# Patient Record
Sex: Female | Born: 1970 | Race: White | Hispanic: No | Smoking: Former smoker
Health system: Southern US, Community
[De-identification: ages and names within clinical notes are randomized; demographics above are authoritative.]

## PROBLEM LIST (undated history)

## (undated) DIAGNOSIS — F32A Depression, unspecified: Secondary | ICD-10-CM

## (undated) DIAGNOSIS — F419 Anxiety disorder, unspecified: Secondary | ICD-10-CM

## (undated) DIAGNOSIS — F329 Major depressive disorder, single episode, unspecified: Secondary | ICD-10-CM

## (undated) DIAGNOSIS — E162 Hypoglycemia, unspecified: Secondary | ICD-10-CM

## (undated) HISTORY — PX: TONSILLECTOMY: SUR1361

## (undated) HISTORY — PX: ANTERIOR CRUCIATE LIGAMENT REPAIR: SHX115

---

## 2010-03-31 ENCOUNTER — Emergency Department: Payer: Self-pay | Admitting: Emergency Medicine

## 2015-01-09 DIAGNOSIS — Z8781 Personal history of (healed) traumatic fracture: Secondary | ICD-10-CM | POA: Insufficient documentation

## 2015-10-07 ENCOUNTER — Encounter: Payer: Self-pay | Admitting: Emergency Medicine

## 2015-10-07 ENCOUNTER — Emergency Department
Admission: EM | Admit: 2015-10-07 | Discharge: 2015-10-07 | Disposition: A | Discharge status: Home / Self Care | Payer: Medicaid Other | Attending: Emergency Medicine | Admitting: Emergency Medicine

## 2015-10-07 DIAGNOSIS — F17210 Nicotine dependence, cigarettes, uncomplicated: Secondary | ICD-10-CM | POA: Insufficient documentation

## 2015-10-07 DIAGNOSIS — R42 Dizziness and giddiness: Secondary | ICD-10-CM | POA: Diagnosis present

## 2015-10-07 DIAGNOSIS — E860 Dehydration: Secondary | ICD-10-CM | POA: Diagnosis not present

## 2015-10-07 HISTORY — DX: Hypoglycemia, unspecified: E16.2

## 2015-10-07 LAB — URINALYSIS COMPLETE WITH MICROSCOPIC (ARMC ONLY)
BACTERIA UA: NONE SEEN
Bilirubin Urine: NEGATIVE
GLUCOSE, UA: NEGATIVE mg/dL
HGB URINE DIPSTICK: NEGATIVE
Leukocytes, UA: NEGATIVE
NITRITE: NEGATIVE
PROTEIN: NEGATIVE mg/dL
Specific Gravity, Urine: 1.001 — ABNORMAL LOW (ref 1.005–1.030)
pH: 6 (ref 5.0–8.0)

## 2015-10-07 LAB — CBC
HEMATOCRIT: 41.7 % (ref 35.0–47.0)
HEMOGLOBIN: 13.9 g/dL (ref 12.0–16.0)
MCH: 31.2 pg (ref 26.0–34.0)
MCHC: 33.3 g/dL (ref 32.0–36.0)
MCV: 93.8 fL (ref 80.0–100.0)
Platelets: 228 10*3/uL (ref 150–440)
RBC: 4.45 MIL/uL (ref 3.80–5.20)
RDW: 13.3 % (ref 11.5–14.5)
WBC: 8.5 10*3/uL (ref 3.6–11.0)

## 2015-10-07 LAB — BASIC METABOLIC PANEL
ANION GAP: 7 (ref 5–15)
BUN: 9 mg/dL (ref 6–20)
CO2: 25 mmol/L (ref 22–32)
Calcium: 8.5 mg/dL — ABNORMAL LOW (ref 8.9–10.3)
Chloride: 101 mmol/L (ref 101–111)
Creatinine, Ser: 0.67 mg/dL (ref 0.44–1.00)
GFR calc Af Amer: >60 mL/min (ref >60)
GFR calc non Af Amer: >60 mL/min (ref >60)
GLUCOSE: 90 mg/dL (ref 65–99)
POTASSIUM: 3.8 mmol/L (ref 3.5–5.1)
Sodium: 133 mmol/L — ABNORMAL LOW (ref 135–145)

## 2015-10-07 LAB — GLUCOSE, CAPILLARY: Glucose-Capillary: 76 mg/dL (ref 65–99)

## 2015-10-07 MED ORDER — SODIUM CHLORIDE 0.9 % IV BOLUS (SEPSIS)
1000.0000 mL | Freq: Once | INTRAVENOUS | Status: AC
  Administered 2015-10-07: 1000 mL via INTRAVENOUS

## 2015-10-07 NOTE — ED Provider Notes (Addendum)
[facility] Emergency Department Provider Note  ____________________________________________   I have reviewed the triage vital signs and the nursing notes.   HISTORY  Chief Complaint Dizziness    HPI Susan Schaefer is a 45 y.o. female who is healthy aside from "hypoglycemia" every once well when she does not eat which causes her to get lightheaded and tobacco abuse. Not a drinker. States that yesterday she had no food all day long essentially since breakfast to the end of the day and she felt very lightheaded and had to sit down on a box. She states she came very close to passing out. She denies falling or hitting her head. She had no seizure activity. This is not unusual when she does not eat. Today she had a sandwich at lunch and she still feels lightheaded. She has had lightheadedness all day. She denies any true vertigo, she has no neurologic symptoms and her review of systems is otherwise completely normal. She denies any rash fever chills nausea vomiting diarrhea, melena, bright red blood per rectum, headache, stiff neck, palpitations, she denies early cardiac history in her family, she denies chest pain she denies shortness of breath she denies abdominal pain, she denies dysuria she denies urinary frequency she denies any focal numbness or weakness. Her only complaint is that she feels somewhat lightheaded. She thinks she is dehydrated. She has not had much to drink today.  Past Medical History  Diagnosis Date  . Hypoglycemia     There are no active problems to display for this patient.   Past Surgical History  Procedure Laterality Date  . Tonsillectomy      No current outpatient prescriptions on file.  Allergies Codeine; Hydrocodone; and Tramadol  No family history on file.  Social History Social History  Substance Use Topics  . Smoking status: Current Every Day Smoker -- 0.50 packs/day    Types: Cigarettes  . Smokeless tobacco: None  .  Alcohol Use: No    Review of Systems Constitutional: No fever/chills Eyes: No visual changes. ENT: No sore throat. No stiff neck no neck pain Cardiovascular: Denies chest pain. Respiratory: Denies shortness of breath. Gastrointestinal:   no vomiting.  No diarrhea.  No constipation. Genitourinary: Negative for dysuria. Musculoskeletal: Negative lower extremity swelling Skin: Negative for rash. Neurological: Negative for headaches, focal weakness or numbness. 10-point ROS otherwise negative.  ____________________________________________   PHYSICAL EXAM:  VITAL SIGNS: ED Triage Vitals  Enc Vitals Group     BP 10/07/15 1605 120/80 mmHg     Pulse Rate 10/07/15 1605 85     Resp 10/07/15 1605 20     Temp 10/07/15 1605 97.8 F (36.6 C)     Temp Source 10/07/15 1605 Oral     SpO2 10/07/15 1605 100 %     Weight 10/07/15 1605 155 lb (70.308 kg)     Height 10/07/15 1605  (1.6 m)     Head Cir --      Peak Flow --      Pain Score --      Pain Loc --      Pain Edu? --      Excl. in GC? --     Constitutional: Alert and oriented. Well appearing and in no acute distress. Eyes: Conjunctivae are normal. PERRL. EOMI. Head: Atraumatic. Nose: No congestion/rhinnorhea., TMs and the left shows some degree of scar from old otitis media but no active effusion or erythema, right TM is normal. Mouth/Throat: Mucous membranes are  moist.  Oropharynx non-erythematous. Neck: No stridor.   Nontender with no meningismus Cardiovascular: Normal rate, regular rhythm. Grossly normal heart sounds.  Good peripheral circulation. Respiratory: Normal respiratory effort.  No retractions. Lungs CTAB. Abdominal: Soft and nontender. No distention. No guarding no rebound Back:  There is no focal tenderness or step off there is no midline tenderness there are no lesions noted. there is no CVA tenderness Musculoskeletal: No lower extremity tenderness. No joint effusions, no DVT signs strong distal pulses no  edema Neurologic:  Cranial nerves II through XII are grossly intact 5 out of 5 strength bilateral upper and lower extremity. Finger to nose within normal limits heel to shin within normal limits, speech is normal with no word finding difficulty or dysarthria, reflexes symmetric, pupils are equally round and reactive to light, there is no pronator drift, sensation is normal, vision is intact to confrontation, gait is deferred, there is no nystagmus, normal neurologic exam Skin:  Skin is warm, dry and intact. No rash noted. Psychiatric: Mood and affect are normal. Speech and behavior are normal.  ____________________________________________   LABS (all labs ordered are listed, but only abnormal results are displayed)  Labs Reviewed  BASIC METABOLIC PANEL - Abnormal; Notable for the following:    Sodium 133 (*)    Calcium 8.5 (*)    All other components within normal limits  GLUCOSE, CAPILLARY  CBC  URINALYSIS COMPLETEWITH MICROSCOPIC ([facility] ONLY)  CBG MONITORING, ED   ____________________________________________  EKG  I personally interpreted any EKGs ordered by me or triage Normal sinus rhythm at 77 bpm no acute ST elevation or acute ST depression normal axis unremarkable EKG ____________________________________________  RADIOLOGY  I reviewed any imaging ordered by me or triage that were performed during my shift ____________________________________________   PROCEDURES  Procedure(s) performed: None  Critical Care performed: None  ____________________________________________   INITIAL IMPRESSION / ASSESSMENT AND PLAN / ED COURSE  Pertinent labs & imaging results that were available during my care of the patient were reviewed by me and considered in my medical decision making (see chart for details).  Patient presents today complaining of feeling lightheaded. Vital signs are reassuring blood work is reassuring thus far exam is normal no evidence of CVA NIH stroke scale 0  no evidence of CAD no evidence of PE no evidence of anemia. The number of things that can cause someone to feel lightheaded R of course or the neck and in enumerate here but her exam is normal her vital signs are normal and her basic blood work is normal and her EKG are normal. I feel that patient would benefit from IV fluid as she feels dehydrated we will give her fluids and reassess  ----------------------------------------- 5:49 PM on 10/07/2015 -----------------------------------------  After IV fluid patient's states she feels 100% better. She is ambulating in the department with no difficulty. We'll see if she can tolerate by mouth she does not feel lightheaded. She has trace ketones in her urine and states that she has not been drinking very much fluid of the last few days because she has been so busy at work. No evidence of cardiogenic neurogenic, coagulopathic, intra-abdominal, or other acute pathology suggestive of the need for admission or further workup today. ____________________________________________   FINAL CLINICAL IMPRESSION(S) / ED DIAGNOSES  Final diagnoses:  None      This chart was dictated using voice recognition software.  Despite best efforts to proofread,  errors can occur which can change meaning.     Fayrene Fearing  Lynnea Maizes, MD 10/07/15 1711  Jeanmarie Plant, MD 10/07/15 [phone]

## 2015-10-07 NOTE — ED Notes (Signed)
Pt provided with orange juice.

## 2015-10-07 NOTE — Discharge Instructions (Signed)

## 2015-10-07 NOTE — ED Notes (Signed)
Pt presents to the ER from home with complaints of dizziness. Pt reports she was working yesterday and passed out, denies any injury, reports this morning when she woke up she felt dizzy. Pt reports she has a history of hypoglycemia, current CBG 76. Pt denies any pain, reports just dizziness, reports she has had lunch. Pt talks in complete sentences no respiratory distress noted.

## 2015-10-12 DIAGNOSIS — L732 Hidradenitis suppurativa: Secondary | ICD-10-CM | POA: Insufficient documentation

## 2015-10-12 DIAGNOSIS — N921 Excessive and frequent menstruation with irregular cycle: Secondary | ICD-10-CM | POA: Insufficient documentation

## 2015-11-26 ENCOUNTER — Encounter: Payer: Self-pay | Admitting: Emergency Medicine

## 2015-11-26 ENCOUNTER — Emergency Department
Admission: EM | Admit: 2015-11-26 | Discharge: 2015-11-26 | Disposition: A | Discharge status: Home / Self Care | Payer: Medicaid Other | Attending: Emergency Medicine | Admitting: Emergency Medicine

## 2015-11-26 DIAGNOSIS — E162 Hypoglycemia, unspecified: Secondary | ICD-10-CM | POA: Diagnosis not present

## 2015-11-26 DIAGNOSIS — N938 Other specified abnormal uterine and vaginal bleeding: Secondary | ICD-10-CM | POA: Diagnosis not present

## 2015-11-26 DIAGNOSIS — F17210 Nicotine dependence, cigarettes, uncomplicated: Secondary | ICD-10-CM | POA: Insufficient documentation

## 2015-11-26 LAB — CBC
HEMATOCRIT: 40.6 % (ref 35.0–47.0)
HEMOGLOBIN: 14 g/dL (ref 12.0–16.0)
MCH: 31.3 pg (ref 26.0–34.0)
MCHC: 34.4 g/dL (ref 32.0–36.0)
MCV: 91 fL (ref 80.0–100.0)
Platelets: 224 10*3/uL (ref 150–440)
RBC: 4.46 MIL/uL (ref 3.80–5.20)
RDW: 12.7 % (ref 11.5–14.5)
WBC: 9.6 10*3/uL (ref 3.6–11.0)

## 2015-11-26 LAB — COMPREHENSIVE METABOLIC PANEL
ALT: 11 U/L — ABNORMAL LOW (ref 14–54)
ANION GAP: 6 (ref 5–15)
AST: 10 U/L — ABNORMAL LOW (ref 15–41)
Albumin: 4.3 g/dL (ref 3.5–5.0)
Alkaline Phosphatase: 70 U/L (ref 38–126)
BILIRUBIN TOTAL: 0.6 mg/dL (ref 0.3–1.2)
BUN: 9 mg/dL (ref 6–20)
CO2: 22 mmol/L (ref 22–32)
Calcium: 8.7 mg/dL — ABNORMAL LOW (ref 8.9–10.3)
Chloride: 104 mmol/L (ref 101–111)
Creatinine, Ser: 0.78 mg/dL (ref 0.44–1.00)
GFR calc Af Amer: >60 mL/min (ref >60)
Glucose, Bld: 88 mg/dL (ref 65–99)
POTASSIUM: 4.1 mmol/L (ref 3.5–5.1)
Sodium: 132 mmol/L — ABNORMAL LOW (ref 135–145)
TOTAL PROTEIN: 7.3 g/dL (ref 6.5–8.1)

## 2015-11-26 LAB — HCG, QUANTITATIVE, PREGNANCY: hCG, Beta Chain, Quant, S: 1 m[IU]/mL (ref <5)

## 2015-11-26 NOTE — Discharge Instructions (Signed)

## 2015-11-26 NOTE — ED Notes (Signed)
Pt informed to return if any life threatening symptoms occur.  

## 2015-11-26 NOTE — ED Notes (Signed)
Pt to ed with c/o vaginal bleeding x 3 weeks.  Pt states she is going through 4 tampons in 1 hour today.  Pt reports she is on depo, called her dr and was told to come here.

## 2015-11-27 NOTE — ED Provider Notes (Signed)
[facility] Emergency Department Provider Note  ____________________________________________    I have reviewed the triage vital signs and the nursing notes.   HISTORY  Chief Complaint Vaginal Bleeding    HPI Susan Schaefer is a 45 y.o. female who presents with vaginal bleeding. Patient reports she has a long history of abnormal bleeding. She has been on Depo for several months but cannot remember when she received the shot. She reports she has been bleeding for approximately 3 weeks intermittently but today was worse than normal. She used tampons in one hour. She does report it seems to have improved since then. No abdominal pain or dizziness.     Past Medical History  Diagnosis Date  . Hypoglycemia     There are no active problems to display for this patient.   Past Surgical History  Procedure Laterality Date  . Tonsillectomy      No current outpatient prescriptions on file.  Allergies Codeine; Doxycycline; Hydrocodone; and Tramadol  History reviewed. No pertinent family history.  Social History Social History  Substance Use Topics  . Smoking status: Current Schaefer Day Smoker -- 0.50 packs/day    Types: Cigarettes  . Smokeless tobacco: None  . Alcohol Use: No    Review of Systems  Constitutional: Negative for dizziness Eyes: Negative for pallor ENT: Negative for sore throat Cardiovascular: Negative for chest pain Respiratory: Negative for shortness of breath. Gastrointestinal: Negative for abdominal pain Genitourinary: Negative for dysuria. As above Musculoskeletal: Negative for muscle pain Skin: Negative for pallor Neurological: Negative for focal weakness Psychiatric: no anxiety    ____________________________________________   PHYSICAL EXAM:  VITAL SIGNS: ED Triage Vitals  Enc Vitals Group     BP 11/26/15 1130 122/80 mmHg     Pulse Rate 11/26/15 1130 97     Resp 11/26/15 1130 18     Temp 11/26/15 1130 98.6 F  (37 C)     Temp Source 11/26/15 1130 Oral     SpO2 11/26/15 1130 95 %     Weight 11/26/15 1130 154 lb (69.854 kg)     Height 11/26/15 1130 5' 3.5" (1.613 m)     Head Cir --      Peak Flow --      Pain Score 11/26/15 1130 6     Pain Loc --      Pain Edu? --      Excl. in GC? --     Constitutional: Alert and oriented. Well appearing and in no distress. Pleasant and interactive Eyes: Conjunctivae are normal. No erythema or injection ENT   Head: Normocephalic and atraumatic.   Mouth/Throat: Mucous membranes are moist. Cardiovascular: Normal rate, regular rhythm. Normal and symmetric distal pulses are present in the upper extremities.  Respiratory: Normal respiratory effort without tachypnea nor retractions.  Gastrointestinal: Soft and non-tender in all quadrants. No distention. There is no CVA tenderness. Genitourinary: deferred  Neurologic:  Normal speech and language. No gross focal neurologic deficits are appreciated. Skin:  Skin is warm, dry and intact. No rash noted. Psychiatric: Mood and affect are normal. Patient exhibits appropriate insight and judgment.  ____________________________________________    LABS (pertinent positives/negatives)  Labs Reviewed  COMPREHENSIVE METABOLIC PANEL - Abnormal; Notable for the following:    Sodium 132 (*)    Calcium 8.7 (*)    AST 10 (*)    ALT 11 (*)    All other components within normal limits  HCG, QUANTITATIVE, PREGNANCY  CBC    ____________________________________________  EKG  None  ____________________________________________    RADIOLOGY  None  ____________________________________________   PROCEDURES  Procedure(s) performed: none  Critical Care performed: none  ____________________________________________   INITIAL IMPRESSION / ASSESSMENT AND PLAN / ED COURSE  Pertinent labs & imaging results that were available during my care of the patient were reviewed by me and considered in my medical  decision making (see chart for details).  Patient well-appearing and in no distress. Vital signs are normal. HCG is negative. Hemoglobin is normal. I discussed with her the need for GYN follow-up and she agrees. She would like to treat this more aggressively. I discussed with her GYN exam given ongoing bleeding and she would prefer not to do an exam at this time which I agree with. ____________________________________________   FINAL CLINICAL IMPRESSION(S) / ED DIAGNOSES  Final diagnoses:  DUB (dysfunctional uterine bleeding)          Susan Every, MD 11/27/15 [phone]

## 2016-01-08 NOTE — H&P (Signed)
Chief Complaint:    [name] is a 45 y.o. female [facility] for men[facility]agia  EMBX : negative  SIS : 2 small fibroids 21x22 mm + 22x22 mm No endometrial pathology seen    Past Medical History:  has no past medical history on file.  Past Surgical History:  has a past surgical history that includes knee surgery and Tonsillectomy. Family History: family history includes Depression in her brother and father. Social History:  reports that she has been smoking Cigarettes. She has a 42.00 pack-year smoking history. She has never used smokeless tobacco. She reports that she drinks about 1.2 oz of alcohol per week She reports that she does not use illicit drugs. OB/GYN History:  OB History    Gravida Para Term Preterm AB TAB SAB Ectopic Multiple Living   1 1 1       1       Allergies: is allergic to codeine; tramadol; and hydrocodone. Medications:  Current Outpatient Prescriptions:  . escitalopram oxalate (LEXAPRO) 10 MG tablet, Take 10 mg by mouth once daily., Disp: , Rfl:  . medroxyPROGESTERone (DEPO-PROVERA) 150 mg/mL injection, Inject 1 mL (150 mg total) into the muscle every 3 (three) months., Disp: 1 mL, Rfl: prn  Current Facility-Administered Medications:  . medroxyPROGESTERone (DEPO-PROVERA) injection 150 mg, 150 mg, Intramuscular, Q90 Days, Susan MouldElizabeth Burney White, NP, 150 mg at 10/12/15 1609  Review of Systems: General:   No fatigue or weight loss Eyes:   No vision changes Ears:   No hearing difficulty Respiratory:   No cough or shortness of breath Pulmonary:   No asthma or shortness of breath Cardiovascular:  No chest pain, palpitations, dyspnea on exertion Gastrointestinal:  No abdominal bloating, chronic diarrhea, constipations, masses, pain or hematochezia Genitourinary:  No hematuria, dysuria, abnormal vaginal discharge, pelvic pain, Menometrorrhagia Lymphatic:  No swollen lymph nodes Musculoskeletal: No muscle  weakness Neurologic:  No extremity weakness, syncope, seizure disorder Psychiatric:  No history of depression, delusions or suicidal/homicidal ideation   Exam:   There were no vitals filed for this visit.  There is no height or weight on file to calculate BMI.  WDWN Schaefer/  female in NAD  Lungs: CTA  CV : RRR without murmur  Breast: exam done in sitting and lying position : No dimpling or retraction, no dominant mass, no spontaneous discharge, no axillary adenopathy Neck: no thyromegaly Abdomen: soft , no mass, normal active bowel sounds, non-tender, no rebound tenderness Pelvic: tanner stage 5 ,  External genitalia: vulva /labia no lesions Urethra: no prolapse Vagina: normal physiologic d/c Cervix: no lesions, no cervical motion tenderness  Uterus: normal size shape and contour, non-tender Adnexa: no mass, non-tender  Rectovaginal: no mass heme negative  Impression:   The encounter diagnosis was Menometrorrhagia. No source of bleeding noted on SIS   Plan:   I have spoken with the patient regarding treatment options including expectant management, hormonal options, or surgical intervention. After a full discussion the pt [facility]s [facility]      Susan PraderHOMAS JANSE [name], MD       Electronically signed by Susan Praderhomas Janse [name], MD at 12/13/2015 12:01 PM

## 2016-01-09 ENCOUNTER — Encounter
Admission: RE | Admit: 2016-01-09 | Discharge: 2016-01-09 | Disposition: A | Discharge status: Home / Self Care | Payer: Medicaid Other | Source: Ambulatory Visit | Attending: Obstetrics and Gynecology | Admitting: Obstetrics and Gynecology

## 2016-01-09 DIAGNOSIS — Z01812 Encounter for preprocedural laboratory examination: Secondary | ICD-10-CM | POA: Insufficient documentation

## 2016-01-09 HISTORY — DX: Major depressive disorder, single episode, unspecified: F32.9

## 2016-01-09 HISTORY — DX: Anxiety disorder, unspecified: F41.9

## 2016-01-09 HISTORY — DX: Depression, unspecified: F32.A

## 2016-01-09 LAB — CBC
HCT: 43 % (ref 35.0–47.0)
Hemoglobin: 14.1 g/dL (ref 12.0–16.0)
MCH: 31.3 pg (ref 26.0–34.0)
MCHC: 32.9 g/dL (ref 32.0–36.0)
MCV: 95.1 fL (ref 80.0–100.0)
PLATELETS: 208 10*3/uL (ref 150–440)
RBC: 4.52 MIL/uL (ref 3.80–5.20)
RDW: 13.5 % (ref 11.5–14.5)
WBC: 6 10*3/uL (ref 3.6–11.0)

## 2016-01-09 LAB — BASIC METABOLIC PANEL
Anion gap: 5 (ref 5–15)
BUN: 13 mg/dL (ref 6–20)
CHLORIDE: 106 mmol/L (ref 101–111)
CO2: 27 mmol/L (ref 22–32)
CREATININE: 0.81 mg/dL (ref 0.44–1.00)
Calcium: 8.8 mg/dL — ABNORMAL LOW (ref 8.9–10.3)
GFR calc Af Amer: >60 mL/min (ref >60)
GFR calc non Af Amer: >60 mL/min (ref >60)
Glucose, Bld: 79 mg/dL (ref 65–99)
Potassium: 4.1 mmol/L (ref 3.5–5.1)
Sodium: 138 mmol/L (ref 135–145)

## 2016-01-09 LAB — TYPE AND SCREEN
ABO/RH(D): A POS
Antibody Screen: NEGATIVE

## 2016-01-09 LAB — ABO/RH: ABO/RH(D): A POS

## 2016-01-09 NOTE — Patient Instructions (Signed)
Your procedure is scheduled on: Monday 01/14/16 Report to Day Surgery. 2ND FLOOR MEDICAL MALL ENTRANCE To find out your arrival time please call [phone] between 1PM - 3PM on Friday 01/11/16.  Remember: Instructions that are not followed completely may result in serious medical risk, up to and including death, or upon the discretion of your surgeon and anesthesiologist your surgery may need to be rescheduled.    __X__ 1. Do not eat food or drink liquids after midnight. No gum chewing or hard candies.     __X__ 2. No Alcohol for 24 hours before or after surgery.   ____ 3. Bring all medications with you on the day of surgery if instructed.    __X__ 4. Notify your doctor if there is any change in your medical condition     (cold, fever, infections).     Do not wear jewelry, make-up, hairpins, clips or nail polish.  Do not wear lotions, powders, or perfumes.   Do not shave 48 hours prior to surgery. Men may shave face and neck.  Do not bring valuables to the hospital.    [facility] is not responsible for any belongings or valuables.               Contacts, dentures or bridgework may not be worn into surgery.  Leave your suitcase in the car. After surgery it may be brought to your room.  For patients admitted to the hospital, discharge time is determined by your                treatment team.   Patients discharged the day of surgery will not be allowed to drive home.   Please read over the following fact sheets that you were given:   Surgical Site Infection Prevention   ____ Take these medicines the morning of surgery with A SIP OF WATER:    1. NONE  2.   3.   4.  5.  6.  ____ Fleet Enema (as directed)   __X__ Use CHG Soap as directed  ____ Use inhalers on the day of surgery  ____ Stop metformin 2 days prior to surgery    ____ Take 1/2 of usual insulin dose the night before surgery and none on the morning of surgery.   ____ Stop Coumadin/Plavix/aspirin on   __X__  Stop Anti-inflammatories on TODAY (IBUPROFEN) MAY USE TYLENOL    ____ Stop supplements until after surgery.    ____ Bring C-Pap to the hospital.

## 2016-01-14 ENCOUNTER — Encounter
Admission: RE | Disposition: A | Discharge status: Home / Self Care | Payer: Self-pay | Source: Ambulatory Visit | Attending: Obstetrics and Gynecology

## 2016-01-14 ENCOUNTER — Ambulatory Visit: Payer: Medicaid Other | Admitting: Anesthesiology

## 2016-01-14 ENCOUNTER — Observation Stay
Admission: RE | Admit: 2016-01-14 | Discharge: 2016-01-15 | Disposition: A | Discharge status: Home / Self Care | Payer: Medicaid Other | Source: Ambulatory Visit | Attending: Obstetrics and Gynecology | Admitting: Obstetrics and Gynecology

## 2016-01-14 DIAGNOSIS — F419 Anxiety disorder, unspecified: Secondary | ICD-10-CM | POA: Diagnosis not present

## 2016-01-14 DIAGNOSIS — Z885 Allergy status to narcotic agent status: Secondary | ICD-10-CM | POA: Diagnosis not present

## 2016-01-14 DIAGNOSIS — N888 Other specified noninflammatory disorders of cervix uteri: Secondary | ICD-10-CM | POA: Diagnosis not present

## 2016-01-14 DIAGNOSIS — Z79899 Other long term (current) drug therapy: Secondary | ICD-10-CM | POA: Diagnosis not present

## 2016-01-14 DIAGNOSIS — Z818 Family history of other mental and behavioral disorders: Secondary | ICD-10-CM | POA: Insufficient documentation

## 2016-01-14 DIAGNOSIS — N920 Excessive and frequent menstruation with regular cycle: Secondary | ICD-10-CM | POA: Diagnosis present

## 2016-01-14 DIAGNOSIS — F329 Major depressive disorder, single episode, unspecified: Secondary | ICD-10-CM | POA: Diagnosis not present

## 2016-01-14 DIAGNOSIS — N800 Endometriosis of uterus: Secondary | ICD-10-CM | POA: Insufficient documentation

## 2016-01-14 DIAGNOSIS — N879 Dysplasia of cervix uteri, unspecified: Secondary | ICD-10-CM | POA: Insufficient documentation

## 2016-01-14 DIAGNOSIS — Z9889 Other specified postprocedural states: Secondary | ICD-10-CM | POA: Diagnosis not present

## 2016-01-14 DIAGNOSIS — F17210 Nicotine dependence, cigarettes, uncomplicated: Secondary | ICD-10-CM | POA: Insufficient documentation

## 2016-01-14 DIAGNOSIS — Z793 Long term (current) use of hormonal contraceptives: Secondary | ICD-10-CM | POA: Diagnosis not present

## 2016-01-14 HISTORY — PX: VAGINAL HYSTERECTOMY: SHX2639

## 2016-01-14 HISTORY — PX: BILATERAL SALPINGECTOMY: SHX5743

## 2016-01-14 LAB — POCT PREGNANCY, URINE: PREG TEST UR: NEGATIVE

## 2016-01-14 LAB — GLUCOSE, CAPILLARY: GLUCOSE-CAPILLARY: 99 mg/dL (ref 65–99)

## 2016-01-14 SURGERY — HYSTERECTOMY, VAGINAL
Anesthesia: General

## 2016-01-14 MED ORDER — FAMOTIDINE 20 MG PO TABS
ORAL_TABLET | ORAL | Status: AC
  Administered 2016-01-14: 20 mg via ORAL
  Filled 2016-01-14: qty 1

## 2016-01-14 MED ORDER — ONDANSETRON HCL 4 MG/2ML IJ SOLN
4.0000 mg | Freq: Once | INTRAMUSCULAR | Status: DC | PRN
Start: 2016-01-14 — End: 2016-01-14

## 2016-01-14 MED ORDER — LACTATED RINGERS IV SOLN
INTRAVENOUS | Status: DC
  Administered 2016-01-14 (×2): via INTRAVENOUS

## 2016-01-14 MED ORDER — MORPHINE SULFATE (PF) 2 MG/ML IV SOLN
1.0000 mg | INTRAVENOUS | Status: DC | PRN
  Administered 2016-01-14 (×2): 2 mg via INTRAVENOUS
  Filled 2016-01-14 (×2): qty 1

## 2016-01-14 MED ORDER — FENTANYL CITRATE (PF) 100 MCG/2ML IJ SOLN
25.0000 ug | INTRAMUSCULAR | Status: DC | PRN
  Administered 2016-01-14 (×4): 25 ug via INTRAVENOUS

## 2016-01-14 MED ORDER — PROPOFOL 10 MG/ML IV BOLUS
INTRAVENOUS | Status: DC | PRN
  Administered 2016-01-14: 20 mg via INTRAVENOUS
  Administered 2016-01-14: 150 mg via INTRAVENOUS

## 2016-01-14 MED ORDER — FAMOTIDINE 20 MG PO TABS
20.0000 mg | ORAL_TABLET | Freq: Once | ORAL | Status: AC
  Administered 2016-01-14: 20 mg via ORAL

## 2016-01-14 MED ORDER — MEPERIDINE HCL 50 MG PO TABS
100.0000 mg | ORAL_TABLET | ORAL | Status: DC | PRN
  Administered 2016-01-14 – 2016-01-15 (×4): 100 mg via ORAL
  Filled 2016-01-14 (×4): qty 2

## 2016-01-14 MED ORDER — MIDAZOLAM HCL 2 MG/2ML IJ SOLN
INTRAMUSCULAR | Status: DC | PRN
  Administered 2016-01-14: 2 mg via INTRAVENOUS

## 2016-01-14 MED ORDER — ONDANSETRON 4 MG PO TBDP: 4.0000 mg | ORAL_TABLET | Freq: Four times a day (QID) | ORAL | Status: DC | PRN

## 2016-01-14 MED ORDER — NEOSTIGMINE METHYLSULFATE 10 MG/10ML IV SOLN
INTRAVENOUS | Status: DC | PRN
  Administered 2016-01-14: 3 mg via INTRAVENOUS

## 2016-01-14 MED ORDER — LACTATED RINGERS IV SOLN: INTRAVENOUS | Status: DC

## 2016-01-14 MED ORDER — EPHEDRINE SULFATE 50 MG/ML IJ SOLN
INTRAMUSCULAR | Status: DC | PRN
  Administered 2016-01-14: 7.5 mg via INTRAVENOUS

## 2016-01-14 MED ORDER — HYDROMORPHONE HCL 1 MG/ML IJ SOLN
INTRAMUSCULAR | Status: DC | PRN
  Administered 2016-01-14: 1 mg via INTRAVENOUS

## 2016-01-14 MED ORDER — LIDOCAINE HCL (CARDIAC) 20 MG/ML IV SOLN
INTRAVENOUS | Status: DC | PRN
  Administered 2016-01-14: 40 mg via INTRAVENOUS

## 2016-01-14 MED ORDER — CITRIC ACID-SODIUM CITRATE 334-500 MG/5ML PO SOLN
30.0000 mL | ORAL | Status: DC
  Filled 2016-01-14: qty 30

## 2016-01-14 MED ORDER — DIPHENHYDRAMINE HCL 50 MG/ML IJ SOLN
INTRAMUSCULAR | Status: AC
  Administered 2016-01-14: 25 mg via INTRAVENOUS
  Filled 2016-01-14: qty 1

## 2016-01-14 MED ORDER — LIDOCAINE-EPINEPHRINE 1 %-1:100000 IJ SOLN
INTRAMUSCULAR | Status: AC
  Filled 2016-01-14: qty 1

## 2016-01-14 MED ORDER — ROCURONIUM BROMIDE 100 MG/10ML IV SOLN
INTRAVENOUS | Status: DC | PRN
  Administered 2016-01-14: 50 mg via INTRAVENOUS

## 2016-01-14 MED ORDER — GLYCOPYRROLATE 0.2 MG/ML IJ SOLN
INTRAMUSCULAR | Status: DC | PRN
  Administered 2016-01-14: 0.4 mg via INTRAVENOUS

## 2016-01-14 MED ORDER — DIPHENHYDRAMINE HCL 50 MG/ML IJ SOLN
25.0000 mg | Freq: Once | INTRAMUSCULAR | Status: AC
  Administered 2016-01-14: 25 mg via INTRAVENOUS

## 2016-01-14 MED ORDER — LIDOCAINE-EPINEPHRINE 1 %-1:100000 IJ SOLN
INTRAMUSCULAR | Status: DC | PRN
  Administered 2016-01-14: 7 mL

## 2016-01-14 MED ORDER — LACTATED RINGERS IV SOLN
INTRAVENOUS | Status: DC
  Administered 2016-01-15: via INTRAVENOUS

## 2016-01-14 MED ORDER — ACETAMINOPHEN 10 MG/ML IV SOLN
INTRAVENOUS | Status: DC | PRN
  Administered 2016-01-14: 1000 mg via INTRAVENOUS

## 2016-01-14 MED ORDER — PHENYLEPHRINE HCL 10 MG/ML IJ SOLN
INTRAMUSCULAR | Status: DC | PRN
  Administered 2016-01-14: 100 ug via INTRAVENOUS
  Administered 2016-01-14 (×2): 200 ug via INTRAVENOUS
  Administered 2016-01-14: 100 ug via INTRAVENOUS

## 2016-01-14 MED ORDER — ONDANSETRON HCL 4 MG/2ML IJ SOLN: 4.0000 mg | Freq: Four times a day (QID) | INTRAMUSCULAR | Status: DC | PRN

## 2016-01-14 MED ORDER — FENTANYL CITRATE (PF) 100 MCG/2ML IJ SOLN
INTRAMUSCULAR | Status: AC
  Administered 2016-01-14: 25 ug via INTRAVENOUS
  Filled 2016-01-14: qty 2

## 2016-01-14 MED ORDER — CEFOXITIN SODIUM-DEXTROSE 2-2.2 GM-% IV SOLR (PREMIX)
2.0000 g | INTRAVENOUS | Status: AC
  Administered 2016-01-14: 2000 mg via INTRAVENOUS

## 2016-01-14 MED ORDER — KETOROLAC TROMETHAMINE 30 MG/ML IJ SOLN
30.0000 mg | Freq: Three times a day (TID) | INTRAMUSCULAR | Status: DC | PRN
Start: 2016-01-14 — End: 2016-01-15
  Administered 2016-01-14 – 2016-01-15 (×3): 30 mg via INTRAVENOUS
  Filled 2016-01-14 (×4): qty 1

## 2016-01-14 MED ORDER — LIDOCAINE HCL (PF) 4 % IJ SOLN
INTRAMUSCULAR | Status: DC | PRN
  Administered 2016-01-14: 4 mL via RESPIRATORY_TRACT

## 2016-01-14 MED ORDER — ACETAMINOPHEN 10 MG/ML IV SOLN
INTRAVENOUS | Status: AC
  Filled 2016-01-14: qty 100

## 2016-01-14 MED ORDER — KETAMINE HCL 50 MG/ML IJ SOLN
INTRAMUSCULAR | Status: DC | PRN
  Administered 2016-01-14: 35 mg via INTRAVENOUS

## 2016-01-14 MED ORDER — FENTANYL CITRATE (PF) 100 MCG/2ML IJ SOLN
INTRAMUSCULAR | Status: DC | PRN
  Administered 2016-01-14: 200 ug via INTRAVENOUS
  Administered 2016-01-14: 50 ug via INTRAVENOUS

## 2016-01-14 MED ORDER — ONDANSETRON HCL 4 MG/2ML IJ SOLN
INTRAMUSCULAR | Status: DC | PRN
  Administered 2016-01-14: 4 mg via INTRAVENOUS

## 2016-01-14 MED ORDER — CEFOXITIN SODIUM-DEXTROSE 2-2.2 GM-% IV SOLR (PREMIX)
INTRAVENOUS | Status: AC
  Administered 2016-01-14: 2000 mg via INTRAVENOUS
  Filled 2016-01-14: qty 50

## 2016-01-14 SURGICAL SUPPLY — 30 items
BAG URO DRAIN 2000ML W/SPOUT (MISCELLANEOUS) ×3 IMPLANT
CANISTER SUCT 1200ML W/VALVE (MISCELLANEOUS) ×3 IMPLANT
CATH FOLEY 2WAY  5CC 16FR (CATHETERS) ×1
CATH ROBINSON RED A/P 16FR (CATHETERS) ×3 IMPLANT
CATH URTH 16FR FL 2W BLN LF (CATHETERS) ×2 IMPLANT
DRAPE PERI LITHO V/GYN (MISCELLANEOUS) ×3 IMPLANT
DRAPE SURG 17X11 SM STRL (DRAPES) ×3 IMPLANT
DRAPE UNDER BUTTOCK W/FLU (DRAPES) ×3 IMPLANT
ELECT REM PT RETURN 9FT ADLT (ELECTROSURGICAL) ×3
ELECTRODE REM PT RTRN 9FT ADLT (ELECTROSURGICAL) ×2 IMPLANT
GLOVE BIO SURGEON STRL SZ8 (GLOVE) ×24 IMPLANT
GOWN STRL REUS W/ TWL LRG LVL3 (GOWN DISPOSABLE) ×6 IMPLANT
GOWN STRL REUS W/ TWL XL LVL3 (GOWN DISPOSABLE) ×2 IMPLANT
GOWN STRL REUS W/TWL LRG LVL3 (GOWN DISPOSABLE) ×3
GOWN STRL REUS W/TWL XL LVL3 (GOWN DISPOSABLE) ×1
KIT RM TURNOVER CYSTO AR (KITS) ×3 IMPLANT
LABEL OR SOLS (LABEL) ×3 IMPLANT
NDL SAFETY 22GX1.5 (NEEDLE) ×3 IMPLANT
PACK BASIN MINOR ARMC (MISCELLANEOUS) ×3 IMPLANT
PAD OB MATERNITY 4.3X12.25 (PERSONAL CARE ITEMS) ×3 IMPLANT
PAD PREP 24X41 OB/GYN DISP (PERSONAL CARE ITEMS) ×3 IMPLANT
SUT PDS 2-0 27IN (SUTURE) ×3 IMPLANT
SUT VIC AB 0 CT1 27 (SUTURE) ×2
SUT VIC AB 0 CT1 27XCR 8 STRN (SUTURE) ×4 IMPLANT
SUT VIC AB 0 CT1 36 (SUTURE) ×3 IMPLANT
SUT VIC AB 2-0 SH 27 (SUTURE) ×1
SUT VIC AB 2-0 SH 27XBRD (SUTURE) ×2 IMPLANT
SYR CONTROL 10ML (SYRINGE) ×3 IMPLANT
SYRINGE 10CC LL (SYRINGE) ×3 IMPLANT
WATER STERILE IRR 1000ML POUR (IV SOLUTION) ×3 IMPLANT

## 2016-01-14 NOTE — Progress Notes (Signed)
Pt ready for surgery TVH and bilateral salpingectomy . Neg HCG and labs reviewed . Pt is NPO . All questions answered .

## 2016-01-14 NOTE — Anesthesia Postprocedure Evaluation (Signed)
Anesthesia Post Note  Patient: Sherilyn CooterHeather M Matton  Procedure(s) Performed: Procedure(s) (LRB): HYSTERECTOMY VAGINAL (N/A) BILATERAL SALPINGECTOMY (Bilateral)  Patient location during evaluation: PACU Anesthesia Type: General Level of consciousness: awake and alert Pain management: pain level controlled Vital Signs Assessment: post-procedure vital signs reviewed and stable Respiratory status: spontaneous breathing, nonlabored ventilation, respiratory function stable and patient connected to nasal cannula oxygen Cardiovascular status: blood pressure returned to baseline and stable Postop Assessment: no signs of nausea or vomiting Anesthetic complications: no    Last Vitals:  Filed Vitals:   01/14/16 1220 01/14/16 1326  BP: 94/59 95/58  Pulse: 68 88  Temp: 36.1 C 36.6 C  Resp: 14 16    Last Pain:  Filed Vitals:   01/14/16 1326  PainSc: Asleep                 Lenard SimmerAndrew Alyah Boehning

## 2016-01-14 NOTE — Brief Op Note (Signed)
01/14/2016  11:07 AM  PATIENT:  Susan Schaefer  45 y.o. female  PRE-OPERATIVE DIAGNOSIS:  menometorrhagia  POST-OPERATIVE DIAGNOSIS:  menometorrhagia  PROCEDURE:  Procedure(s): HYSTERECTOMY VAGINAL (N/A) BILATERAL SALPINGECTOMY (Bilateral)  SURGEON:  Surgeon(s) and Role:    * Suzy Bouchardhomas J Chayim Bialas, MD - Primary    * Christeen DouglasBethany Beasley, MD - Assisting  PHYSICIAN ASSISTANT:   ASSISTANTS:scrub tech   ANESTHESIA:   general  EBL:  Total I/O In: -  Out: 475 [Urine:400; Blood:75]  BLOOD ADMINISTERED:none  DRAINS: Urinary Catheter (Foley)   LOCAL MEDICATIONS USED:  LIDOCAINE  and Amount: 10 ml  SPECIMEN: cervix , uterus and bilat fallopian tubes  DISPOSITION OF SPECIMEN:  PATHOLOGY  COUNTS:  YES  TOURNIQUET:  * No tourniquets in log *  DICTATION: .Other Dictation: Dictation Number verbal  PLAN OF CARE: Admit for overnight observation  PATIENT DISPOSITION:  PACU - hemodynamically stable.   Delay start of Pharmacological VTE agent (>24hrs) due to surgical blood loss or risk of bleeding: not applicable

## 2016-01-14 NOTE — Anesthesia Procedure Notes (Signed)
Procedure Name: Intubation Date/Time: 01/14/2016 9:48 AM Performed by: Shirlee LimerickMARION, Jonne Rote Pre-anesthesia Checklist: Patient identified, Emergency Drugs available, Suction available and Patient being monitored Patient Re-evaluated:Patient Re-evaluated prior to inductionOxygen Delivery Method: Circle system utilized Preoxygenation: Pre-oxygenation with 100% oxygen Intubation Type: IV induction Laryngoscope Size: Mac and 3 Grade View: Grade II Tube type: Oral Tube size: 7.0 mm Number of attempts: 1 Placement Confirmation: ETT inserted through vocal cords under direct vision,  positive ETCO2 and breath sounds checked- equal and bilateral Secured at: 21 cm Tube secured with: Tape Dental Injury: Teeth and Oropharynx as per pre-operative assessment

## 2016-01-14 NOTE — Transfer of Care (Signed)
Immediate Anesthesia Transfer of Care Note  Patient: Susan Schaefer  Procedure(s) Performed: Procedure(s): HYSTERECTOMY VAGINAL (N/A) BILATERAL SALPINGECTOMY (Bilateral)  Patient Location: PACU  Anesthesia Type:General  Level of Consciousness: awake, alert , oriented and patient cooperative  Airway & Oxygen Therapy: Patient Spontanous Breathing and Patient connected to nasal cannula oxygen  Post-op Assessment: Report given to RN and Post -op Vital signs reviewed and stable  Post vital signs: Reviewed and stable  Last Vitals:  Filed Vitals:   01/14/16 0824 01/14/16 0922  BP: 116/72   Pulse: 93   Temp: 37 C 36.7 C  Resp: 16     Last Pain: There were no vitals filed for this visit.       Complications: No apparent anesthesia complications

## 2016-01-14 NOTE — Anesthesia Preprocedure Evaluation (Signed)
Anesthesia Evaluation  Patient identified by MRN, date of birth, ID band Patient awake    Reviewed: Allergy & Precautions, H&P , NPO status , Patient's Chart, lab work & pertinent test results, reviewed documented beta blocker date and time   History of Anesthesia Complications Negative for: history of anesthetic complications  Airway Mallampati: I  TM Distance: >3 FB Neck ROM: full    Dental no notable dental hx. (+) Teeth Intact   Pulmonary neg shortness of breath, neg sleep apnea, neg COPD, neg recent URI, Current Smoker,    Pulmonary exam normal breath sounds clear to auscultation       Cardiovascular Exercise Tolerance: Good negative cardio ROS Normal cardiovascular exam Rhythm:regular Rate:Normal     Neuro/Psych PSYCHIATRIC DISORDERS (Depression and anxiety) negative neurological ROS     GI/Hepatic negative GI ROS, Neg liver ROS,   Endo/Other  negative endocrine ROS  Renal/GU negative Renal ROS  negative genitourinary   Musculoskeletal   Abdominal   Peds  Hematology negative hematology ROS (+)   Anesthesia Other Findings Past Medical History:   Hypoglycemia                                                 Anxiety                                                      Depression                                                   Reproductive/Obstetrics negative OB ROS                             Anesthesia Physical Anesthesia Plan  ASA: II  Anesthesia Plan: General   Post-op Pain Management:    Induction:   Airway Management Planned:   Additional Equipment:   Intra-op Plan:   Post-operative Plan:   Informed Consent: I have reviewed the patients History and Physical, chart, labs and discussed the procedure including the risks, benefits and alternatives for the proposed anesthesia with the patient or authorized representative who has indicated his/her understanding and  acceptance.   Dental Advisory Given  Plan Discussed with: Anesthesiologist, CRNA and Surgeon  Anesthesia Plan Comments:         Anesthesia Quick Evaluation

## 2016-01-14 NOTE — OR Nursing (Signed)
Patient given and instructed how to use Incentive spirometer in PAT, with patient belongings.

## 2016-01-14 NOTE — Progress Notes (Signed)
Patient ID: Susan CooterHeather M Schaefer, female   DOB: [birth year], 45 y.o.   MRN: [id number] DOS , no c/o  vss Good urine output  No blood on pad  A: stable  P: labs in am

## 2016-01-15 ENCOUNTER — Encounter: Payer: Self-pay | Admitting: Obstetrics and Gynecology

## 2016-01-15 DIAGNOSIS — N920 Excessive and frequent menstruation with regular cycle: Secondary | ICD-10-CM | POA: Diagnosis not present

## 2016-01-15 LAB — BASIC METABOLIC PANEL
Anion gap: 3 — ABNORMAL LOW (ref 5–15)
BUN: 10 mg/dL (ref 6–20)
CHLORIDE: 106 mmol/L (ref 101–111)
CO2: 26 mmol/L (ref 22–32)
CREATININE: 0.64 mg/dL (ref 0.44–1.00)
Calcium: 8 mg/dL — ABNORMAL LOW (ref 8.9–10.3)
GFR calc non Af Amer: >60 mL/min (ref >60)
Glucose, Bld: 96 mg/dL (ref 65–99)
POTASSIUM: 4 mmol/L (ref 3.5–5.1)
SODIUM: 135 mmol/L (ref 135–145)

## 2016-01-15 LAB — CBC
HCT: 36 % (ref 35.0–47.0)
HEMOGLOBIN: 12.3 g/dL (ref 12.0–16.0)
MCH: 31.6 pg (ref 26.0–34.0)
MCHC: 34.2 g/dL (ref 32.0–36.0)
MCV: 92.3 fL (ref 80.0–100.0)
Platelets: 169 10*3/uL (ref 150–440)
RBC: 3.9 MIL/uL (ref 3.80–5.20)
RDW: 13.5 % (ref 11.5–14.5)
WBC: 8.2 10*3/uL (ref 3.6–11.0)

## 2016-01-15 MED ORDER — ONDANSETRON HCL 8 MG PO TABS: 8.0000 mg | ORAL_TABLET | Freq: Three times a day (TID) | ORAL | Status: DC | PRN

## 2016-01-15 MED ORDER — DOCUSATE SODIUM 100 MG PO CAPS: 100.0000 mg | ORAL_CAPSULE | Freq: Two times a day (BID) | ORAL | Status: DC

## 2016-01-15 MED ORDER — IBUPROFEN 200 MG PO TABS: ORAL_TABLET | ORAL | Status: DC

## 2016-01-15 MED ORDER — MEPERIDINE HCL 50 MG PO TABS: 50.0000 mg | ORAL_TABLET | ORAL | Status: DC | PRN

## 2016-01-15 NOTE — Progress Notes (Signed)
Discharge instructions reviewed with patient.  All questions answered.  Follow up appointment scheduled.  

## 2016-01-15 NOTE — Op Note (Signed)
NAME:  Birdie SonsBRADSHAW, Zilpha                 ACCOUNT NO.:  MEDICAL RECORD NO.:  [id number]  LOCATION:                                 FACILITY:  PHYSICIAN:  Jennell Cornerhomas Schermerhorn, MDDATE OF BIRTH:  [birth year]  DATE OF PROCEDURE: DATE OF DISCHARGE:                              OPERATIVE REPORT   PREOPERATIVE DIAGNOSIS:  Menorrhagia, unresponsive to conservative treatment.  POSTOPERATIVE DIAGNOSIS:  Menorrhagia, unresponsive to conservative treatment.  PROCEDURE PERFORMED:  Total vaginal hysterectomy, bilateral salpingectomy.  SURGEON:  Jennell Cornerhomas Schermerhorn, MD  ANESTHESIA:  General endotracheal anesthesia.  SURGEON:  Jennell Cornerhomas Schermerhorn, MD  FIRST ASSISTANT:  Dalbert GarnetBeasley.  INDICATIONS:  A 45 year old, gravida 1, para 1 patient with a long history of heavy menstrual flow, unresponsive to conservative therapy. Workup was negative for endometrial masses or endometrial hyperplasia or cancer.  DESCRIPTION OF PROCEDURE:  After adequate general endotracheal anesthesia, patient was placed in dorsal supine position with legs in the candy-cane stirrups.  The patient was prepped and draped in normal sterile fashion.  The patient did receive 2 g IV cefoxitin prior to commencement of the case.  A time-out was performed and straight catheterization of the bladder yielded 350 mL clear urine.  A weighted speculum was placed in the posterior vaginal vault and a direct posterior colpotomy incision was made upon entry into the posterior cul- de-sac.  The uterosacral ligaments were bilaterally clamped, transected, and suture ligated with 0 Vicryl suture and tagged for later identification.  The anterior cervix was circumferentially incised with the Bovie.  The anterior cul-de-sac was entered sharply.  Cardinal ligaments were bilaterally clamped, transected, suture ligated with 0 Vicryl suture.  The uterine arteries were bilaterally clamped, transected, suture ligated with 0 Vicryl suture.  Sequential  bites continued up until the cornua which was bilaterally clamped and suture ligated with 0 Vicryl suture.  The fallopian tubes were identified. Each tube was clamped and excised and doubly ligated with 0 Vicryl suture.  A small benign-appearing left ovarian cyst was noted and cyst wall was opened and straw fluid draining from this.  Good hemostasis was noted.  The peritoneum was then closed with a 2-0 PDS suture in a pursestring fashion and the vaginal vault was then closed with a running 0 Vicryl suture.  The uterosacral ligaments were plicated centrally and the rest of the vault was closed.  There were no complications.  Good hemostasis was noted.  Foley catheter was placed at the end of the case yielding additional 50 mL clear urine.  INTRAOPERATIVE FLUIDS:  1000 mL.  ESTIMATED BLOOD LOSS:  75 mL.  URINE OUTPUT:  400 mL.  Patient tolerated the procedure, was taken to recovery room in good condition.          ______________________________ Jennell Cornerhomas Schermerhorn, MD     TS/MEDQ  D:  01/14/2016  T:  01/15/2016  Job:  [id number]

## 2016-01-15 NOTE — Discharge Summary (Signed)
Physician Discharge Summary  Patient ID: Susan Schaefer MRN: [id number] DOB/AGE: November 26, 1970 45 y.o.  Admit date: 01/14/2016 Discharge date: 01/15/2016  Admission Diagnoses:menorrhagia  Discharge Diagnoses: same  Active Problems:   Postoperative state   Discharged Condition: good  Hospital Course: underwent an uncomplicate TVH and bilateral salpingectomy . Post op hct 36%  Consults: None  Significant Diagnostic Studies: labs: cbc and met b nl post op   Treatments: surgery: TVH and bila salpingectomy   Discharge Exam: Blood pressure 114/74, pulse 77, temperature 98 F (36.7 C), temperature source Oral, resp. rate 18, weight 154 lb (69.854 kg), SpO2 96 %. Lungs cta  CV RRR  adb : soft nt Pelvic no blood   Disposition: 01-Home or Self Care  Discharge Instructions    Call MD for:  difficulty breathing, headache or visual disturbances    Complete by:  As directed      Call MD for:  extreme fatigue    Complete by:  As directed      Call MD for:  hives    Complete by:  As directed      Call MD for:  persistant dizziness or light-headedness    Complete by:  As directed      Call MD for:  persistant nausea and vomiting    Complete by:  As directed      Call MD for:  redness, tenderness, or signs of infection (pain, swelling, redness, odor or green/yellow discharge around incision site)    Complete by:  As directed      Call MD for:  severe uncontrolled pain    Complete by:  As directed      Call MD for:  temperature >100.4    Complete by:  As directed      Diet - low sodium heart healthy    Complete by:  As directed      Increase activity slowly    Complete by:  As directed             Medication List    STOP taking these medications        medroxyPROGESTERone 150 MG/ML injection  Commonly known as:  DEPO-PROVERA      TAKE these medications        docusate sodium 100 MG capsule  Commonly known as:  COLACE  Take 1 capsule (100 mg total) by mouth 2 (two)  times daily.     escitalopram 10 MG tablet  Commonly known as:  LEXAPRO  Take 10 mg by mouth at bedtime.     ibuprofen 200 MG tablet  Commonly known as:  ADVIL  Take 2-4 tabs q 8 hrs prn pain     meperidine 50 MG tablet  Commonly known as:  DEMEROL  Take 1 tablet (50 mg total) by mouth every 4 (four) hours as needed for severe pain.     ondansetron 8 MG tablet  Commonly known as:  ZOFRAN  Take 1 tablet (8 mg total) by mouth every 8 (eight) hours as needed for nausea or vomiting.           Follow-up Information    Follow up with SCHERMERHORN,THOMAS, MD In 2 weeks.   Specialty:  Obstetrics and Gynecology   Why:  For wound re-check   Contact information:   [address] [place] [address] [phone]       Signed: Laverta Baltimore 01/15/2016, 8:56 AM

## 2016-01-16 LAB — SURGICAL PATHOLOGY

## 2016-05-06 ENCOUNTER — Encounter: Payer: Self-pay | Admitting: *Deleted

## 2016-05-06 ENCOUNTER — Ambulatory Visit
Admission: EM | Admit: 2016-05-06 | Discharge: 2016-05-06 | Disposition: A | Discharge status: Home / Self Care | Payer: Worker's Compensation | Attending: Family Medicine | Admitting: Family Medicine

## 2016-05-06 DIAGNOSIS — S32001A Stable burst fracture of unspecified lumbar vertebra, initial encounter for closed fracture: Secondary | ICD-10-CM

## 2016-05-06 MED ORDER — OXYCODONE-ACETAMINOPHEN 5-325 MG PO TABS: 1.0000 | ORAL_TABLET | Freq: Four times a day (QID) | ORAL | 0 refills | Status: DC | PRN
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
## 2016-05-06 NOTE — Discharge Instructions (Signed)
Recommendations per Neurosurgery who evaluated and treated [facility].
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
## 2016-12-11 ENCOUNTER — Emergency Department
Admission: EM | Admit: 2016-12-11 | Discharge: 2016-12-11 | Disposition: A | Discharge status: Home / Self Care | Payer: BLUE CROSS/BLUE SHIELD | Attending: Emergency Medicine | Admitting: Emergency Medicine

## 2016-12-11 ENCOUNTER — Encounter: Payer: Self-pay | Admitting: Emergency Medicine

## 2016-12-11 ENCOUNTER — Emergency Department: Payer: BLUE CROSS/BLUE SHIELD

## 2016-12-11 DIAGNOSIS — S6991XA Unspecified injury of right wrist, hand and finger(s), initial encounter: Secondary | ICD-10-CM | POA: Diagnosis present

## 2016-12-11 DIAGNOSIS — S60221A Contusion of right hand, initial encounter: Secondary | ICD-10-CM | POA: Diagnosis not present

## 2016-12-11 DIAGNOSIS — Y929 Unspecified place or not applicable: Secondary | ICD-10-CM | POA: Insufficient documentation

## 2016-12-11 DIAGNOSIS — Y9389 Activity, other specified: Secondary | ICD-10-CM | POA: Insufficient documentation

## 2016-12-11 DIAGNOSIS — Y998 Other external cause status: Secondary | ICD-10-CM | POA: Diagnosis not present

## 2016-12-11 DIAGNOSIS — F17210 Nicotine dependence, cigarettes, uncomplicated: Secondary | ICD-10-CM | POA: Insufficient documentation

## 2016-12-11 DIAGNOSIS — W228XXA Striking against or struck by other objects, initial encounter: Secondary | ICD-10-CM | POA: Diagnosis not present

## 2016-12-11 MED ORDER — NAPROXEN 500 MG PO TBEC: 500.0000 mg | DELAYED_RELEASE_TABLET | Freq: Two times a day (BID) | ORAL | 0 refills | Status: AC

## 2016-12-11 NOTE — ED Notes (Signed)
+  2 pulse right radius, full ROM right wrist. Fingers warm, decreased ROM
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
## 2018-10-18 ENCOUNTER — Other Ambulatory Visit: Payer: Self-pay

## 2018-10-18 ENCOUNTER — Emergency Department
Admission: EM | Admit: 2018-10-18 | Discharge: 2018-10-19 | Disposition: A | Discharge status: Other Acute Inpatient Hospital | Payer: BLUE CROSS/BLUE SHIELD | Attending: Emergency Medicine | Admitting: Emergency Medicine

## 2018-10-18 DIAGNOSIS — X838XXA Intentional self-harm by other specified means, initial encounter: Secondary | ICD-10-CM | POA: Insufficient documentation

## 2018-10-18 DIAGNOSIS — T43012A Poisoning by tricyclic antidepressants, intentional self-harm, initial encounter: Secondary | ICD-10-CM | POA: Insufficient documentation

## 2018-10-18 DIAGNOSIS — Y9389 Activity, other specified: Secondary | ICD-10-CM | POA: Insufficient documentation

## 2018-10-18 DIAGNOSIS — F419 Anxiety disorder, unspecified: Secondary | ICD-10-CM | POA: Insufficient documentation

## 2018-10-18 DIAGNOSIS — F329 Major depressive disorder, single episode, unspecified: Secondary | ICD-10-CM | POA: Insufficient documentation

## 2018-10-18 DIAGNOSIS — Z79899 Other long term (current) drug therapy: Secondary | ICD-10-CM | POA: Insufficient documentation

## 2018-10-18 DIAGNOSIS — Z87891 Personal history of nicotine dependence: Secondary | ICD-10-CM | POA: Insufficient documentation

## 2018-10-18 DIAGNOSIS — Y999 Unspecified external cause status: Secondary | ICD-10-CM | POA: Insufficient documentation

## 2018-10-18 DIAGNOSIS — T50902A Poisoning by unspecified drugs, medicaments and biological substances, intentional self-harm, initial encounter: Secondary | ICD-10-CM

## 2018-10-18 DIAGNOSIS — Y92009 Unspecified place in unspecified non-institutional (private) residence as the place of occurrence of the external cause: Secondary | ICD-10-CM | POA: Insufficient documentation

## 2018-10-18 LAB — URINALYSIS, COMPLETE (UACMP) WITH MICROSCOPIC
BACTERIA UA: NONE SEEN
Bilirubin Urine: NEGATIVE
GLUCOSE, UA: NEGATIVE mg/dL
HGB URINE DIPSTICK: NEGATIVE
KETONES UR: 20 mg/dL — AB
LEUKOCYTE UA: NEGATIVE
NITRITE: NEGATIVE
PROTEIN: NEGATIVE mg/dL
Specific Gravity, Urine: 1.016 (ref 1.005–1.030)
pH: 6 (ref 5.0–8.0)

## 2018-10-18 LAB — COMPREHENSIVE METABOLIC PANEL
ALK PHOS: 85 U/L (ref 38–126)
ALT: 10 U/L (ref 0–44)
ANION GAP: 6 (ref 5–15)
AST: 16 U/L (ref 15–41)
Albumin: 4.2 g/dL (ref 3.5–5.0)
BILIRUBIN TOTAL: 1 mg/dL (ref 0.3–1.2)
BUN: 15 mg/dL (ref 6–20)
CALCIUM: 8.5 mg/dL — AB (ref 8.9–10.3)
CO2: 25 mmol/L (ref 22–32)
CREATININE: 0.78 mg/dL (ref 0.44–1.00)
Chloride: 105 mmol/L (ref 98–111)
GFR calc non Af Amer: >60 mL/min (ref >60)
GLUCOSE: 117 mg/dL — AB (ref 70–99)
Potassium: 4.5 mmol/L (ref 3.5–5.1)
Sodium: 136 mmol/L (ref 135–145)
Total Protein: 7.7 g/dL (ref 6.5–8.1)

## 2018-10-18 LAB — MAGNESIUM: MAGNESIUM: 2 mg/dL (ref 1.7–2.4)

## 2018-10-18 LAB — CBC WITH DIFFERENTIAL/PLATELET
Abs Immature Granulocytes: 0.05 10*3/uL (ref 0.00–0.07)
Basophils Absolute: 0 10*3/uL (ref 0.0–0.1)
Basophils Relative: 0 %
EOS PCT: 0 %
Eosinophils Absolute: 0 10*3/uL (ref 0.0–0.5)
HCT: 40.5 % (ref 36.0–46.0)
HEMOGLOBIN: 13.1 g/dL (ref 12.0–15.0)
Immature Granulocytes: 1 %
LYMPHS ABS: 0.8 10*3/uL (ref 0.7–4.0)
LYMPHS PCT: 7 %
MCH: 29.8 pg (ref 26.0–34.0)
MCHC: 32.3 g/dL (ref 30.0–36.0)
MCV: 92.3 fL (ref 80.0–100.0)
MONO ABS: 0.3 10*3/uL (ref 0.1–1.0)
MONOS PCT: 2 %
Neutro Abs: 9.8 10*3/uL — ABNORMAL HIGH (ref 1.7–7.7)
Neutrophils Relative %: 90 %
Platelets: 235 10*3/uL (ref 150–400)
RBC: 4.39 MIL/uL (ref 3.87–5.11)
RDW: 11.7 % (ref 11.5–15.5)
WBC: 11 10*3/uL — AB (ref 4.0–10.5)
nRBC: 0 % (ref 0.0–0.2)

## 2018-10-18 LAB — ETHANOL: Alcohol, Ethyl (B): <10 mg/dL (ref <10)

## 2018-10-18 LAB — URINE DRUG SCREEN, QUALITATIVE (ARMC ONLY)
Amphetamines, Ur Screen: NOT DETECTED
Barbiturates, Ur Screen: NOT DETECTED
Benzodiazepine, Ur Scrn: NOT DETECTED
Cannabinoid 50 Ng, Ur ~~LOC~~: NOT DETECTED
Cocaine Metabolite,Ur ~~LOC~~: NOT DETECTED
MDMA (ECSTASY) UR SCREEN: NOT DETECTED
Methadone Scn, Ur: NOT DETECTED
OPIATE, UR SCREEN: NOT DETECTED
Phencyclidine (PCP) Ur S: NOT DETECTED
Tricyclic, Ur Screen: POSITIVE — AB

## 2018-10-18 LAB — SALICYLATE LEVEL

## 2018-10-18 LAB — ACETAMINOPHEN LEVEL: Acetaminophen (Tylenol), Serum: <10 ug/mL — ABNORMAL LOW (ref 10–30)

## 2018-10-18 LAB — PROTIME-INR
INR: 0.92
PROTHROMBIN TIME: 12.3 s (ref 11.4–15.2)

## 2018-10-18 LAB — GLUCOSE, CAPILLARY: GLUCOSE-CAPILLARY: 109 mg/dL — AB (ref 70–99)

## 2018-10-18 MED ORDER — SODIUM CHLORIDE 0.9 % IV BOLUS
1000.0000 mL | Freq: Once | INTRAVENOUS | Status: AC
  Administered 2018-10-18: 1000 mL via INTRAVENOUS

## 2018-10-18 NOTE — ED Triage Notes (Signed)
Pt to ED via EMS with c/o suicide attempt approx noon today. PT took approx 50 25mg  amitriptyline pills. Pt AO and NAD distress at this time, only complaint of dizziness. Pt transfer to stretcher by self. Pt regrets suicide attempt at this time.

## 2018-10-18 NOTE — ED Notes (Signed)
Assigned as a 1:1 sitter.  Pt IVC'd.  Dressed pt out in [employer].  Pt very cooperative at this time.  Belongings include cell phone, slippers, jeans, underwear, top and bra.  Belongings bagged, labelled and placed at nurses station

## 2018-10-18 NOTE — ED Notes (Signed)
Patient states she took an OD of medication today around 1 o'clock. Patient states she did it because she was auguring with boyfriend. Patient states she is tired, but denies any SI thoughts at this time. Patient denies SI/HI/AVH. Patient states she just made a stupid mistake.

## 2018-10-18 NOTE — ED Notes (Signed)
Pt IVC, psych consult complete

## 2018-10-18 NOTE — ED Provider Notes (Addendum)
[facility] Emergency Department Provider Note  ____________________________________________   I have reviewed the triage vital signs and the nursing notes. Where available I have reviewed prior notes and, if possible and indicated, outside hospital notes.    HISTORY  Chief Complaint Ingestion    HPI Susan Schaefer is a 48 y.o. female who presents today complaining of an overdose.  According to EMS and patient she took approximately 5725 mg amitriptyline tablets in an attempt to harm herself at 130 this afternoon no coingestions no other self-harm behavior.  Patient is remorseful she states.  She does have a history of anxiety and depression.  She was in a verbal argument with her boyfriend when she did it.  She denies being abused in any way she states she is not an abusive relationship.  She denies any other symptoms since she took it her heart rate is been slightly up for EMS but her sugar is been normal.  She has not had any vomiting.  We did call poison control, they recommend watching her for 6 hours after normal vital signs and serial EKGs, once now and once as needed, we will keep her on the monitor.     Past Medical History:  Diagnosis Date  . Anxiety   . Depression   . Hypoglycemia     Patient Active Problem List   Diagnosis Date Noted  . Postoperative state 01/14/2016    Past Surgical History:  Procedure Laterality Date  . ANTERIOR CRUCIATE LIGAMENT REPAIR Left   . BILATERAL SALPINGECTOMY Bilateral 01/14/2016   Procedure: BILATERAL SALPINGECTOMY;  Surgeon: Suzy Bouchard, MD;  Location: [facility] ORS;  Service: Gynecology;  Laterality: Bilateral;  . TONSILLECTOMY    . VAGINAL HYSTERECTOMY N/A 01/14/2016   Procedure: HYSTERECTOMY VAGINAL;  Surgeon: Suzy Bouchard, MD;  Location: [facility] ORS;  Service: Gynecology;  Laterality: N/A;    Prior to Admission medications   Medication Sig Start Date End Date Taking? Authorizing Provider   docusate sodium (COLACE) 100 MG capsule Take 1 capsule (100 mg total) by mouth 2 (two) times daily. 01/15/16   Schermerhorn, Ihor Austin, MD  escitalopram (LEXAPRO) 10 MG tablet Take 10 mg by mouth at bedtime.    [provider]  ibuprofen (ADVIL) 200 MG tablet Take 2-4 tabs q 8 hrs prn pain 01/15/16   Schermerhorn, Ihor Austin, MD  meperidine (DEMEROL) 50 MG tablet Take 1 tablet (50 mg total) by mouth every 4 (four) hours as needed for severe pain. 01/15/16   Schermerhorn, Ihor Austin, MD  ondansetron (ZOFRAN) 8 MG tablet Take 1 tablet (8 mg total) by mouth every 8 (eight) hours as needed for nausea or vomiting. 01/15/16   Schermerhorn, Ihor Austin, MD  oxyCODONE-acetaminophen (PERCOCET/ROXICET) 5-325 MG tablet Take 1 tablet by mouth every 6 (six) hours as needed for severe pain. 05/06/16   Payton Mccallum, MD    Allergies Codeine; Doxycycline; Hydrocodone; and Tramadol  No family history on file.  Social History Social History   Tobacco Use  . Smoking status: Former Smoker    Packs/day: 1.00    Years: 27.00    Pack years: 27.00    Types: Cigarettes  . Smokeless tobacco: Never Used  Substance Use Topics  . Alcohol use: Yes    Comment: every now and then  . Drug use: No    Review of Systems Constitutional: No fever/chills Eyes: No visual changes. ENT: No sore throat. No stiff neck no neck pain Cardiovascular: Denies chest pain. Respiratory:  Denies shortness of breath. Gastrointestinal:   no vomiting.  No diarrhea.  No constipation. Genitourinary: Negative for dysuria. Musculoskeletal: Negative lower extremity swelling Skin: Negative for rash. Neurological: Negative for severe headaches, focal weakness or numbness.   ____________________________________________   PHYSICAL EXAM:  VITAL SIGNS: ED Triage Vitals [10/18/18 1926]  Enc Vitals Group     BP      Pulse      Resp      Temp      Temp src      SpO2      Weight 150 lb (68 kg)     Height 5\' 3"  (1.6 m)     Head  Circumference      Peak Flow      Pain Score 0     Pain Loc      Pain Edu?      Excl. in GC?     Constitutional: Alert and oriented. Well appearing and in no acute distress.  Has an upset Eyes: Conjunctivae are normal Head: Atraumatic HEENT: No congestion/rhinnorhea. Mucous membranes are moist.  Oropharynx non-erythematous Neck:   Nontender with no meningismus, no masses, no stridor Cardiovascular: Normal rate, regular rhythm. Grossly normal heart sounds.  Good peripheral circulation. Respiratory: Normal respiratory effort.  No retractions. Lungs CTAB. Abdominal: Soft and nontender. No distention. No guarding no rebound Back:  There is no focal tenderness or step off.  there is no midline tenderness there are no lesions noted. there is no CVA tenderness  Musculoskeletal: No lower extremity tenderness, no upper extremity tenderness. No joint effusions, no DVT signs strong distal pulses no edema Neurologic:  Normal speech and language. No gross focal neurologic deficits are appreciated.  Skin:  Skin is warm, dry and intact. No rash noted. Psychiatric: Mood and affect are anxious but interactive. Speech and behavior are normal.  ____________________________________________   LABS (all labs ordered are listed, but only abnormal results are displayed)  Labs Reviewed  GLUCOSE, CAPILLARY - Abnormal; Notable for the following components:      Result Value   Glucose-Capillary 109 (*)    All other components within normal limits  CBC WITH DIFFERENTIAL/PLATELET  COMPREHENSIVE METABOLIC PANEL  ETHANOL  SALICYLATE LEVEL  PROTIME-INR  URINALYSIS, COMPLETE (UACMP) WITH MICROSCOPIC  URINE DRUG SCREEN, QUALITATIVE ([facility] ONLY)  ACETAMINOPHEN LEVEL  MAGNESIUM    Pertinent labs  results that were available during my care of the patient were reviewed by me and considered in my medical decision making (see chart for details). ____________________________________________  EKG  I personally  interpreted any EKGs ordered by me or triage Sinus tach rate 110, QRS is 83 QTC is 444 normal axis no ischemic ____________________________________________  RADIOLOGY  Pertinent labs & imaging results that were available during my care of the patient were reviewed by me and considered in my medical decision making (see chart for details). If possible, patient and/or family made aware of any abnormal findings.  No results found. ____________________________________________    PROCEDURES  Procedure(s) performed: None  Procedures  Critical Care performed: None  ____________________________________________   INITIAL IMPRESSION / ASSESSMENT AND PLAN / ED COURSE  Pertinent labs & imaging results that were available during my care of the patient were reviewed by me and considered in my medical decision making (see chart for details).  Here after an overdose, I have taken out IVC paperwork on her, we are going to monitor her in the emergency department.  Will administer bicarb as needed, patient is approximately 1/2  hours after ingestion, and is awake and alert which I take to be a positive sign.  We will continue to monitor her.  Heart rate is somewhat up but she is certainly anxious.  We will continue to watch her for the requisite time as recommended by poison control check for coingestions and reassess   ----------------------------------------- 9:26 PM on 10/18/2018 -----------------------------------------  Resting comfortably heart rate has been normal for the last few hours, her repeat EKG shows sinus at 90 normal axis, QTC at 362 QRS at 77  ----------------------------------------- 11:50 PM on 10/18/2018 -----------------------------------------  nad signed out to dr. Franco Collet at the end of my shifth    ____________________________________________   FINAL CLINICAL IMPRESSION(S) / ED DIAGNOSES  Final diagnoses:  None      This chart was dictated using voice  recognition software.  Despite best efforts to proofread,  errors can occur which can change meaning.      Jeanmarie Plant, MD 10/18/18 1956    Jeanmarie Plant, MD 10/18/18 2126    Jeanmarie Plant, MD 10/18/18 2350

## 2018-10-18 NOTE — ED Provider Notes (Signed)
-----------------------------------------   11:12 PM on 10/18/2018 -----------------------------------------  I took over care of this patient from Dr. Alphonzo Lemmings.  The plan will be to continue to observe until approximately 6 hours after her vital signs normalized, so around 2:30 AM.  At that point the patient will be medically cleared and stable for psychiatric admission.   Dionne Bucy, MD 10/19/18 [id number]

## 2018-10-18 NOTE — BH Assessment (Signed)
Assessment Note  Susan Schaefer is an 48 y.o. female. Susan Schaefer arrived to the ED by way of EMS.  She reports "I took a lot of sleeping pills". She reports that she was arguing with her fianc. She states that we did not speak to each other and left for work, she texted him a couple of times and then took the pills.  She shared that she was upset.  She reports "at the time possibly" wanted to kill herself.  She currently denies that she wants to kill herself.  She denied symptoms of depression. She denied symptoms of anxiety.   She denied auditory or visual hallucinations. She denied homicidal ideation or intent.  She denied the use of alcohol or drugs.  She denied all stressors.  Patient took an unknown amount of Amitriptylin 25mg  tablets.  Susan Schaefer appeared to be an unreliable reporter.  IVC paperwork reports "Patient took a dangerous amount of medications to kill herself".  Susan Schaefer did not authorize TTS to speak with anyone.   Diagnosis: Depression.  Past Medical History:  Past Medical History:  Diagnosis Date  . Anxiety   . Depression   . Hypoglycemia     Past Surgical History:  Procedure Laterality Date  . ANTERIOR CRUCIATE LIGAMENT REPAIR Left   . BILATERAL SALPINGECTOMY Bilateral 01/14/2016   Procedure: BILATERAL SALPINGECTOMY;  Surgeon: Suzy Bouchard, MD;  Location: [facility] ORS;  Service: Gynecology;  Laterality: Bilateral;  . TONSILLECTOMY    . VAGINAL HYSTERECTOMY N/A 01/14/2016   Procedure: HYSTERECTOMY VAGINAL;  Surgeon: Suzy Bouchard, MD;  Location: [facility] ORS;  Service: Gynecology;  Laterality: N/A;    Family History: No family history on file.  Social History:  reports that she has quit smoking. Her smoking use included cigarettes. She has a 27.00 pack-year smoking history. She has never used smokeless tobacco. She reports current alcohol use. She reports that she does not use drugs.  Additional Social History:  Alcohol / Drug Use History of  alcohol / drug use?: No history of alcohol / drug abuse(Patient denied)  CIWA: CIWA-Ar BP: 118/84 Pulse Rate: 99 COWS:    Allergies:  Allergies  Allergen Reactions  . Codeine   . Doxycycline Nausea And Vomiting  . Hydrocodone   . Tramadol     Home Medications: (Not in a hospital admission)   OB/GYN Status:  Patient's last menstrual period was 10/07/2015 (exact date).  General Assessment Data Location of Assessment: [facility] ED TTS Assessment: In system Is this a Tele or Face-to-Face Assessment?: Face-to-Face Is this an Initial Assessment or a Re-assessment for this encounter?: Initial Assessment Patient Accompanied by:: N/A Language Other than English: No Living Arrangements: Other (Comment)(Private residence) What gender do you identify as?: Female Marital status: Divorced Maiden name: Hilborn Pregnancy Status: No Living Arrangements: Spouse/significant other, Children Can pt return to current living arrangement?: Yes Admission Status: Involuntary Petitioner: ED Attending Is patient capable of signing voluntary admission?: No Referral Source: Self/Family/Friend Insurance type: NOne  Medical Screening Exam [facility] Walk-in ONLY) Medical Exam completed: Yes  Crisis Care Plan Living Arrangements: Spouse/significant other, Children Legal Guardian: Other:(Self) Name of Psychiatrist: None Name of Therapist: None  Education Status Is patient currently in school?: No Is the patient employed, unemployed or receiving disability?: Employed  Risk to self with the past 6 months Suicidal Ideation: Yes-Currently Present Has patient been a risk to self within the past 6 months prior to admission? : Yes Suicidal Intent: Yes-Currently Present Has patient had any suicidal  intent within the past 6 months prior to admission? : Yes Is patient at risk for suicide?: Yes Suicidal Plan?: Yes-Currently Present Has patient had any suicidal plan within the past 6 months prior to admission? :  Yes Specify Current Suicidal Plan: Overdone Access to Means: Yes Specify Access to Suicidal Means: Access to medications What has been your use of drugs/alcohol within the last 12 months?: Denied use Previous Attempts/Gestures: No How many times?: 1 Other Self Harm Risks: denied Triggers for Past Attempts: None known Intentional Self Injurious Behavior: None Family Suicide History: No Recent stressful life event(s): Other (Comment)(Relationship) Persecutory voices/beliefs?: No Depression: No Depression Symptoms: (denied by patient) Substance abuse history and/or treatment for substance abuse?: No Suicide prevention information given to non-admitted patients: Not applicable  Risk to Others within the past 6 months Homicidal Ideation: No Does patient have any lifetime risk of violence toward others beyond the six months prior to admission? : No Thoughts of Harm to Others: No Current Homicidal Intent: No Current Homicidal Plan: No Access to Homicidal Means: No Identified Victim: None identified History of harm to others?: No Assessment of Violence: None Noted Does patient have access to weapons?: No Criminal Charges Pending?: No Does patient have a court date: No Is patient on probation?: No  Psychosis Hallucinations: None noted Delusions: None noted  Mental Status Report Appearance/Hygiene: In scrubs Eye Contact: Poor(Would not open eyes) Motor Activity: Unremarkable Speech: Logical/coherent, Rapid Level of Consciousness: Quiet/awake Mood: Irritable Affect: Angry Anxiety Level: None Thought Processes: Coherent Judgement: Impaired Orientation: Appropriate for developmental age Obsessive Compulsive Thoughts/Behaviors: None  Cognitive Functioning Concentration: Good Memory: Recent Intact Is patient IDD: No Insight: Poor Impulse Control: Poor Appetite: Good Have you had any weight changes? : No Change Sleep: Decreased  ADLScreening [facility] Assessment  Services) Patient's cognitive ability adequate to safely complete daily activities?: Yes Patient able to express need for assistance with ADLs?: Yes Independently performs ADLs?: Yes (appropriate for developmental age)  Prior Inpatient Therapy Prior Inpatient Therapy: No  Prior Outpatient Therapy Prior Outpatient Therapy: No Does patient have an ACCT team?: No Does patient have Intensive In-House Services?  : No Does patient have [facility] services? : No Does patient have P4CC services?: No  ADL Screening (condition at time of admission) Patient's cognitive ability adequate to safely complete daily activities?: Yes Is the patient deaf or have difficulty hearing?: No Does the patient have difficulty seeing, even when wearing glasses/contacts?: No Does the patient have difficulty concentrating, remembering, or making decisions?: No Patient able to express need for assistance with ADLs?: Yes Does the patient have difficulty dressing or bathing?: No Independently performs ADLs?: Yes (appropriate for developmental age) Does the patient have difficulty walking or climbing stairs?: No Weakness of Legs: None Weakness of Arms/Hands: None  Home Assistive Devices/Equipment Home Assistive Devices/Equipment: None    Abuse/Neglect Assessment (Assessment to be complete while patient is alone) Abuse/Neglect Assessment Can Be Completed: (Patient denied a history of abuse)     Advance Directives (For Healthcare) Does Patient Have a Medical Advance Directive?: No          Disposition:  Disposition Initial Assessment Completed for this Encounter: Yes  On Site Evaluation by:   Reviewed with Physician:    Justice Deeds 10/18/2018 10:52 PM

## 2018-10-19 ENCOUNTER — Inpatient Hospital Stay
Admission: AD | Admit: 2018-10-19 | Discharge: 2018-10-21 | DRG: 882 | Disposition: A | Discharge status: Home / Self Care | Payer: No Typology Code available for payment source | Source: Intra-hospital | Attending: Psychiatry | Admitting: Psychiatry

## 2018-10-19 ENCOUNTER — Other Ambulatory Visit: Payer: Self-pay

## 2018-10-19 ENCOUNTER — Encounter: Payer: Self-pay | Admitting: Behavioral Health

## 2018-10-19 DIAGNOSIS — Z915 Personal history of self-harm: Secondary | ICD-10-CM

## 2018-10-19 DIAGNOSIS — F329 Major depressive disorder, single episode, unspecified: Secondary | ICD-10-CM | POA: Diagnosis present

## 2018-10-19 DIAGNOSIS — F419 Anxiety disorder, unspecified: Secondary | ICD-10-CM | POA: Diagnosis present

## 2018-10-19 DIAGNOSIS — Z881 Allergy status to other antibiotic agents status: Secondary | ICD-10-CM | POA: Diagnosis not present

## 2018-10-19 DIAGNOSIS — F4325 Adjustment disorder with mixed disturbance of emotions and conduct: Secondary | ICD-10-CM | POA: Diagnosis present

## 2018-10-19 DIAGNOSIS — Z885 Allergy status to narcotic agent status: Secondary | ICD-10-CM

## 2018-10-19 DIAGNOSIS — R45851 Suicidal ideations: Secondary | ICD-10-CM | POA: Diagnosis present

## 2018-10-19 DIAGNOSIS — T50902A Poisoning by unspecified drugs, medicaments and biological substances, intentional self-harm, initial encounter: Secondary | ICD-10-CM | POA: Insufficient documentation

## 2018-10-19 DIAGNOSIS — T43012A Poisoning by tricyclic antidepressants, intentional self-harm, initial encounter: Secondary | ICD-10-CM | POA: Diagnosis present

## 2018-10-19 MED ORDER — HYDROXYZINE HCL 25 MG PO TABS: 25.0000 mg | ORAL_TABLET | Freq: Three times a day (TID) | ORAL | Status: DC | PRN

## 2018-10-19 MED ORDER — ALUM & MAG HYDROXIDE-SIMETH 200-200-20 MG/5ML PO SUSP: 30.0000 mL | ORAL | Status: DC | PRN

## 2018-10-19 MED ORDER — ACETAMINOPHEN 325 MG PO TABS: 650.0000 mg | ORAL_TABLET | Freq: Four times a day (QID) | ORAL | Status: DC | PRN

## 2018-10-19 MED ORDER — MAGNESIUM HYDROXIDE 400 MG/5ML PO SUSP: 30.0000 mL | Freq: Every day | ORAL | Status: DC | PRN

## 2018-10-19 NOTE — ED Notes (Signed)
Patient on phone with fiance

## 2018-10-19 NOTE — BH Assessment (Signed)
Patient is to be admitted to [facility] by Dr. Viviano Simas.  Attending Physician will be Dr. Toni Amend.   Patient has been assigned to room 304, by [facility] Charge Nurse Gwen.   Intake Paper Work has been signed and placed on patient chart.  ER staff is aware of the admission:  Misty Stanley, ER Secretary    Dr. Don Perking, ER MD   Geralynn Ochs, Patient's Nurse   Ethelene Browns, Patient Access.

## 2018-10-19 NOTE — ED Notes (Signed)
Gave breakfast tray with juice. 

## 2018-10-19 NOTE — ED Notes (Signed)
Hourly rounding reveals patient sleeping in room. No complaints, stable, in no acute distress. Q15 minute rounds and monitoring via Security Cameras to continue. 

## 2018-10-19 NOTE — ED Notes (Signed)
Patient on phone with mother 

## 2018-10-19 NOTE — ED Notes (Signed)
Pt. Transferred to [facility] from ED to room 5 after screening for contraband. Report to include Situation, Background, Assessment and Recommendations from Kim RN. Pt. Oriented to unit including Q15 minute rounds as well as the security cameras for their protection. Patient is alert and oriented, warm and dry in no acute distress. Patient denies SI, HI, and AVH. Pt. Encouraged to let me know if needs arise. 

## 2018-10-19 NOTE — ED Notes (Signed)
Patient in bed resting with eyes closed. Awaiting for patient to be medically cleared. Will continue to monitor.  

## 2018-10-19 NOTE — ED Notes (Signed)
Patient in bed resting with eyes closed.  Patient is medically clear to move to [facility]. Will call report to [facility] RN.
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
## 2018-10-19 NOTE — Plan of Care (Signed)
Patient is a 48 year old female admitted to the BMU after an overdose  of  Amitriptyline. Patient very tearful during admission process. Patient states she has been divorced before and now her fiance is now breaking up with her and she is going to have to move her daughter once again. Patient denies SI, HI and AVH. Patient very regretful for taking medications. Patient has parents in [place], [place]. Patient has a primary care physician in [place]. Patient complains of having anxiety, insomnia. Patient very tearful, sad, poor eye contact on admission. Patient's skin intact and appropriate for ethnicity. Patient oriented to unit and given dinner. Problem: Education: Goal: Knowledge of [facility] Education information/materials will improve Outcome: Not Progressing Goal: Emotional status will improve Outcome: Not Progressing Goal: Mental status will improve Outcome: Not Progressing Goal: Verbalization of understanding the information provided will improve Outcome: Not Progressing   Problem: Activity: Goal: Interest or engagement in activities will improve Outcome: Not Progressing Goal: Sleeping patterns will improve Outcome: Not Progressing

## 2018-10-19 NOTE — ED Notes (Signed)
Hourly rounding reveals patient in room. No complaints, stable, in no acute distress. Q15 minute rounds and monitoring via Security Cameras to continue. 

## 2018-10-19 NOTE — ED Notes (Signed)
Patient asked when could she go home, she did not want to go inpatient she had to take care of her children at home, dicussed with patient that she can not take pills and say she wants to hurt herself and then go home the next day, informed her that she needs to talk about why she did that and think of her children because she may really hurt herself if she did it again.

## 2018-10-19 NOTE — Tx Team (Signed)
Initial Treatment Plan 10/19/2018 5:15 PM [name] UXN:[id number]    PATIENT STRESSORS: Loss of relationship Marital or family conflict   PATIENT STRENGTHS: Ability for insight Communication skills Motivation for treatment/growth Supportive family/friends   PATIENT IDENTIFIED PROBLEMS: Ineffective coping  Powerlessness  Impulsive behavior                 DISCHARGE CRITERIA:  Ability to meet basic life and health needs Adequate post-discharge living arrangements Improved stabilization in mood, thinking, and/or behavior  PRELIMINARY DISCHARGE PLAN: Attend PHP/IOP Return to previous living arrangement Return to previous work or school arrangements  PATIENT/FAMILY INVOLVEMENT: This treatment plan has been presented to and reviewed with the patient, Susan Schaefer, and/or family member.  The patient and family have been given the opportunity to ask questions and make suggestions.  Leamon Arnt, RN 10/19/2018, 5:15 PM

## 2018-10-19 NOTE — ED Notes (Signed)
Patient in bed resting with eyes closed. Awaiting for patient to be medically cleared. Will continue to monitor.

## 2018-10-19 NOTE — ED Notes (Signed)
Patient appears to be sleeping with eyes closed. Vitals WDL. Will continue to monitor.

## 2018-10-20 DIAGNOSIS — F4325 Adjustment disorder with mixed disturbance of emotions and conduct: Principal | ICD-10-CM

## 2018-10-20 MED ORDER — ESCITALOPRAM OXALATE 10 MG PO TABS: 10.0000 mg | ORAL_TABLET | Freq: Every day | ORAL | 0 refills | Status: AC

## 2018-10-20 MED ORDER — ESCITALOPRAM OXALATE 10 MG PO TABS
10.0000 mg | ORAL_TABLET | Freq: Every day | ORAL | Status: DC
  Administered 2018-10-20: 10 mg via ORAL
  Filled 2018-10-20 (×2): qty 1

## 2018-10-20 MED ORDER — ESCITALOPRAM OXALATE 10 MG PO TABS: 10.0000 mg | ORAL_TABLET | Freq: Every day | ORAL | 0 refills | Status: DC

## 2018-10-20 NOTE — [facility] Group Notes (Signed)
LCSW Group Therapy Note  10/20/2018 11:55 AM  Type of Therapy/Topic:  Group Therapy:  Emotion Regulation  Participation Level:  Minimal   Description of Group:   The purpose of this group is to assist patients in learning to regulate negative emotions and experience positive emotions. Patients will be guided to discuss ways in which they have been vulnerable to their negative emotions. These vulnerabilities will be juxtaposed with experiences of positive emotions or situations, and patients will be challenged to use positive emotions to combat negative ones. Special emphasis will be placed on coping with negative emotions in conflict situations, and patients will process healthy conflict resolution skills.  Therapeutic Goals: 1. Patient will identify two positive emotions or experiences to reflect on in order to balance out negative emotions 2. Patient will label two or more emotions that they find the most difficult to experience 3. Patient will demonstrate positive conflict resolution skills through discussion and/or role plays  Summary of Patient Progress: Pt was present in group, but did not engage in the group discussion at all.   Therapeutic Modalities:   Cognitive Behavioral Therapy Feelings Identification Dialectical Behavioral Therapy   Iris Pert, MSW, LCSW Clinical Social Work 10/20/2018 11:55 AM

## 2018-10-20 NOTE — Progress Notes (Signed)
D: Patient stated slept good last night .Stated appetite is good and energy level  Is normal. Stated concentration is good . Stated on Depression scale0 , hopeless 0 and anxiety 0 .( low 0-10 high) Denies suicidal  homicidal ideations  .  No auditory hallucinations  No pain concerns . Appropriate ADL'S. Interacting with peers and staff.   A: Encourage patient participation with unit programming . Instruction  Given on  Medication , verbalize understanding.Patient instructed on unit programing , able to verbalize understanding Emotional and mental status  improving, Attending  unit programing  able to verbalize  feelings    R: Voice no other concerns. Staff continue to monitor

## 2018-10-20 NOTE — Plan of Care (Signed)
Patient is stable and calm oriented x 4, resting comfortably in bed , encourage to verbalize feeling or attributes about self to improve mental and emotional state, patient is compliance with prescribed medications.  Patient understand information provided, encouraged to engage  in leisure activities with peers, safety is maintained , patient denies SI/HI/AVH, patient slept through the night and only requires routine visual checks, 15 safety checks  is maintained. No distress.    Problem: Education: Goal: Knowledge of [facility] Education information/materials will improve Outcome: Progressing Goal: Emotional status will improve Outcome: Progressing Goal: Mental status will improve Outcome: Progressing Goal: Verbalization of understanding the information provided will improve Outcome: Progressing   Problem: Activity: Goal: Interest or engagement in activities will improve Outcome: Progressing Goal: Sleeping patterns will improve Outcome: Progressing   Problem: Health Behavior/Discharge Planning: Goal: Identification of resources available to assist in meeting health care needs will improve Outcome: Progressing   Problem: Self-Concept: Goal: Will verbalize positive feelings about self Outcome: Progressing

## 2018-10-20 NOTE — BHH Suicide Risk Assessment (Signed)
[facility] Discharge Suicide Risk Assessment   Principal Problem: Adjustment disorder with mixed disturbance of emotions and conduct Discharge Diagnoses: Principal Problem:   Adjustment disorder with mixed disturbance of emotions and conduct Active Problems:   Intentional amitriptyline overdose (HCC)   Total Time spent with patient: 1 hour  Musculoskeletal: Strength & Muscle Tone: within normal limits Gait & Station: normal Patient leans: N/A  Psychiatric Specialty Exam: Review of Systems  Constitutional: Negative.   HENT: Negative.   Eyes: Negative.   Respiratory: Negative.   Cardiovascular: Negative.   Gastrointestinal: Negative.   Musculoskeletal: Negative.   Skin: Negative.   Neurological: Negative.   Psychiatric/Behavioral: Negative.     Blood pressure 118/83, pulse (!) 105, temperature 98.7 F (37.1 C), temperature source Oral, resp. rate 16, height 5\' 3"  (1.6 m), weight 68 kg, last menstrual period 10/07/2015, SpO2 98 %.Body mass index is 26.56 kg/m.  General Appearance: Casual  Eye Contact::  Good  Speech:  Clear and Coherent409  Volume:  Normal  Mood:  Euthymic  Affect:  Congruent  Thought Process:  Goal Directed  Orientation:  Full (Time, Place, and Person)  Thought Content:  Logical  Suicidal Thoughts:  No  Homicidal Thoughts:  No  Memory:  Immediate;   Fair Recent;   Fair Remote;   Fair  Judgement:  Fair  Insight:  Fair  Psychomotor Activity:  Normal  Concentration:  Fair  Recall:  [employer] of Knowledge:Fair  Language: Fair  Akathisia:  No  Handed:  Right  AIMS (if indicated):     Assets:  Desire for Improvement  Sleep:  Number of Hours: 8.5  Cognition: WNL  ADL's:  Intact   Mental Status Per Nursing Assessment::   On Admission:  Self-harm behaviors  Demographic Factors:  Caucasian  Loss Factors: Loss of significant relationship  Historical Factors: Impulsivity  Risk Reduction Factors:   Responsible for children under 77 years of age,  Sense of responsibility to family and Religious beliefs about death  Continued Clinical Symptoms:  Severe Anxiety and/or Agitation  Cognitive Features That Contribute To Risk:  Polarized thinking    Suicide Risk:  Minimal: No identifiable suicidal ideation.  Patients presenting with no risk factors but with morbid ruminations; may be classified as minimal risk based on the severity of the depressive symptoms  Follow-up Information    [facility] Follow up on 10/25/2018.   Why:  Meet Unk Pinto on Monday 10/25/2018 at 7:15AM at [facility] for peer support services. Thank you! Contact information: [address] [place] [address] [phone]           Plan Of Care/Follow-up recommendations:  Activity:  Activity as tolerated Diet:  Regular diet Other:  Follow-up with outpatient mental health providers at [facility]  Mordecai Rasmussen, MD 10/20/2018, 5:32 PM

## 2018-10-20 NOTE — BHH Counselor (Signed)
Adult Comprehensive Assessment  Patient ID: JOCEYLN CHASTAIN, female   DOB: April 21, 1971, 48 y.o.   MRN: [id number]  Information Source: Information source: Patient  Current Stressors:  Patient states their primary concerns and needs for treatment are:: "I just want to go home" Patient states their goals for this hospitilization and ongoing recovery are:: "To go home"  Living/Environment/Situation:  Living Arrangements: Spouse/significant other, Children Living conditions (as described by patient or guardian): "its fine, its normal" Who else lives in the home?: Pts fiance and 35 yo daughter How long has patient lived in current situation?: 3 years What is atmosphere in current home: Comfortable, Paramedic, Supportive  Family History:  Marital status: Divorced Divorced, when?: separated from 1st husband in 2012 Additional relationship information: Pt is currently engaged Are you sexually active?: Yes What is your sexual orientation?: heterosexual Has your sexual activity been affected by drugs, alcohol, medication, or emotional stress?: pt denies Does patient have children?: Yes How many children?: 1 How is patient's relationship with their children?: 76 yo daughter, good relationship  Childhood History:  By whom was/is the patient raised?: Both parents Description of patient's relationship with caregiver when they were a child: good Patient's description of current relationship with people who raised him/her: good Does patient have siblings?: Yes Number of Siblings: 1 Description of patient's current relationship with siblings: older brother who pt does not talk to Did patient suffer any verbal/emotional/physical/sexual abuse as a child?: No Did patient suffer from severe childhood neglect?: No Has patient ever been sexually abused/assaulted/raped as an adolescent or adult?: No Was the patient ever a victim of a crime or a disaster?: No Witnessed domestic violence?: No Has patient  been effected by domestic violence as an adult?: No  Education:  Highest grade of school patient has completed: some college Currently a Consulting civil engineer?: No Learning disability?: No  Employment/Work Situation:   Employment situation: Employed Where is patient currently employed?: [employer] How long has patient been employed?: 1.5 year Patient's job has been impacted by current illness: No What is the longest time patient has a held a job?: 13 years Where was the patient employed at that time?: [employer] Did You Receive Any Psychiatric Treatment/Services While in the [employer]?: No Are There Guns or Other Weapons in Your Home?: No Are These Weapons Safely Secured?: (n/a)  Financial Resources:   Financial resources: Income from employment Does patient have a representative payee or guardian?: No  Alcohol/Substance Abuse:   What has been your use of drugs/alcohol within the last 12 months?: pt denies Alcohol/Substance Abuse Treatment Hx: Denies past history Has alcohol/substance abuse ever caused legal problems?: No  Social Support System:   Conservation officer, nature Support System: Fair Museum/gallery exhibitions officer System: fiance, parents, and daughter Type of faith/religion: Chiropodist:   Leisure and Hobbies: crafts, make shirts  Strengths/Needs:   Patient states these barriers may affect/interfere with their treatment: pt denies Patient states these barriers may affect their return to the community: pt denies  Discharge Plan:   Currently receiving community mental health services: No Patient states concerns and preferences for aftercare planning are: Pt is agreeable to [facility] Patient states they will know when they are safe and ready for discharge when: "I know what I did was stupid and I wont do it again" Does patient have access to transportation?: Yes Does patient have financial barriers related to discharge medications?: No Will patient be returning to same  living situation after discharge?: Yes  Summary/Recommendations:  Summary and Recommendations (to be completed by the evaluator): Pt is a 48 yo female living in [place], [place] [facility] [place]) with her fiance and 55 yo daughter. Pt presents to the hospital seeking treatment for SI and depression. Pt is engaged, employed, has no insurance, and denies a history of trauma. Pt is agreeable to [facility] referral. Pt has a diagnosis of MDD. Pt plans to return home at discharge and denies current SI/HI/AVH. Recommendations for pt include: crisis stabilization, therapeutic milieu, encourage group attendance and participation, medication management for mood stabilization, and development for comprehensive mental wellness plan. CSW assessing for appropriate referrals.   Charlann Lange Ahmadou Bolz MSW LCSW 10/20/2018 10:11 AM

## 2018-10-20 NOTE — Tx Team (Signed)
Interdisciplinary Treatment and Diagnostic Plan Update  10/20/2018 Time of Session: 230PM Susan Schaefer MRN: [id number]  Principal Diagnosis: <principal problem not specified>  Secondary Diagnoses: Active Problems:   Intentional amitriptyline overdose (HCC)   Current Medications:  Current Facility-Administered Medications  Medication Dose Route Frequency Provider Last Rate Last Dose  . acetaminophen (TYLENOL) tablet 650 mg  650 mg Oral Q6H PRN Catalina Gravel, NP      . alum & mag hydroxide-simeth (MAALOX/MYLANTA) 200-200-20 MG/5ML suspension 30 mL  30 mL Oral Q4H PRN Catalina Gravel, NP      . escitalopram (LEXAPRO) tablet 10 mg  10 mg Oral Daily Clapacs, John T, MD      . hydrOXYzine (ATARAX/VISTARIL) tablet 25 mg  25 mg Oral TID PRN Catalina Gravel, NP      . magnesium hydroxide (MILK OF MAGNESIA) suspension 30 mL  30 mL Oral Daily PRN Catalina Gravel, NP       PTA Medications: Medications Prior to Admission  Medication Sig Dispense Refill Last Dose  . amitriptyline (ELAVIL) 25 MG tablet Take 1-2 tablets by mouth at bedtime as needed.   10/18/2018 at Unknown time    Patient Stressors: Loss of relationship Marital or family conflict  Patient Strengths: Ability for insight Communication skills Motivation for treatment/growth Supportive family/friends  Treatment Modalities: Medication Management, Group therapy, Case management,  1 to 1 session with clinician, Psychoeducation, Recreational therapy.   Physician Treatment Plan for Primary Diagnosis: <principal problem not specified> Long Term Goal(s):     Short Term Goals:    Medication Management: Evaluate patient's response, side effects, and tolerance of medication regimen.  Therapeutic Interventions: 1 to 1 sessions, Unit Group sessions and Medication administration.  Evaluation of Outcomes: Progressing  Physician Treatment Plan for Secondary Diagnosis: Active Problems:   Intentional  amitriptyline overdose (HCC)  Long Term Goal(s):     Short Term Goals:       Medication Management: Evaluate patient's response, side effects, and tolerance of medication regimen.  Therapeutic Interventions: 1 to 1 sessions, Unit Group sessions and Medication administration.  Evaluation of Outcomes: Progressing   RN Treatment Plan for Primary Diagnosis: <principal problem not specified> Long Term Goal(s): Knowledge of disease and therapeutic regimen to maintain health will improve  Short Term Goals: Ability to verbalize frustration and anger appropriately will improve, Ability to demonstrate self-control, Ability to participate in decision making will improve and Ability to identify and develop effective coping behaviors will improve  Medication Management: RN will administer medications as ordered by provider, will assess and evaluate patient's response and provide education to patient for prescribed medication. RN will report any adverse and/or side effects to prescribing provider.  Therapeutic Interventions: 1 on 1 counseling sessions, Psychoeducation, Medication administration, Evaluate responses to treatment, Monitor vital signs and CBGs as ordered, Perform/monitor CIWA, COWS, AIMS and Fall Risk screenings as ordered, Perform wound care treatments as ordered.  Evaluation of Outcomes: Progressing   LCSW Treatment Plan for Primary Diagnosis: <principal problem not specified> Long Term Goal(s): Safe transition to appropriate next level of care at discharge, Engage patient in therapeutic group addressing interpersonal concerns.  Short Term Goals: Engage patient in aftercare planning with referrals and resources, Increase ability to appropriately verbalize feelings, Increase emotional regulation and Increase skills for wellness and recovery  Therapeutic Interventions: Assess for all discharge needs, 1 to 1 time with Social worker, Explore available resources and support systems, Assess  for adequacy in community support network, Educate family and significant other(s) on suicide prevention,  Complete Psychosocial Assessment, Interpersonal group therapy.  Evaluation of Outcomes: Progressing   Progress in Treatment: Attending groups: Yes. Participating in groups: No. Taking medication as prescribed: Yes. Toleration medication: Yes. Family/Significant other contact made: No, will contact:  SPE completed with pt as pt declined colalteral contact Patient understands diagnosis: Yes. Discussing patient identified problems/goals with staff: Yes. Medical problems stabilized or resolved: Yes. Denies suicidal/homicidal ideation: Yes. Issues/concerns per patient self-inventory: No. Other: n/a  New problem(s) identified: No, Describe:  none  New Short Term/Long Term Goal(s):  medication management for mood stabilization; elimination of SI thoughts; development of comprehensive mental wellness/sobriety plan.   Patient Goals:  "I just want to go home"  Discharge Plan or Barriers: SPE pamphlet, Mobile Crisis information, and AA/NA information provided to patient for additional community support and resources. Pt has an appointment scheduled with [facility] on Monday 10/25/18 at 7:15 AM.  Reason for Continuation of Hospitalization: Depression  Estimated Length of Stay: Tomorrow 10/21/2018  Attendees: Patient:Susan Schaefer 10/20/2018 2:59 PM  Physician: Dr Toni Amend MD 10/20/2018 2:59 PM  Nursing:  10/20/2018 2:59 PM  RN Care Manager: 10/20/2018 2:59 PM  Social Worker: Zollie Scale Patrice Moates LCSW 10/20/2018 2:59 PM  Recreational Therapist: Garret Reddish CTRS LRT 10/20/2018 2:59 PM  Other: Penni Homans LCSW 10/20/2018 2:59 PM  Other:  10/20/2018 2:59 PM  Other: 10/20/2018 2:59 PM    Scribe for Treatment Team: Charlann Lange Osiris Odriscoll, LCSW 10/20/2018 2:59 PM

## 2018-10-20 NOTE — Progress Notes (Signed)
Recreation Therapy Notes  Date: 10/20/2018  Time: 9:30 am   Location: Craft room   Behavioral response: N/A   Intervention Topic: Happiness  Discussion/Intervention: Patient did not attend group.   Clinical Observations/Feedback:  Patient did not attend group.   Sabas Frett LRT/CTRS         Susan Schaefer 10/20/2018 12:22 PM 

## 2018-10-20 NOTE — BHH Suicide Risk Assessment (Signed)
[facility] Admission Suicide Risk Assessment   Nursing information obtained from:  Patient Demographic factors:  Caucasian Current Mental Status:  Self-harm behaviors Loss Factors:  NA Historical Factors:  Impulsivity Risk Reduction Factors:  Positive social support, Sense of responsibility to family, Living with another person, especially a relative, Positive coping skills or problem solving skills  Total Time spent with patient: 1 hour Principal Problem: Adjustment disorder with mixed disturbance of emotions and conduct Diagnosis:  Principal Problem:   Adjustment disorder with mixed disturbance of emotions and conduct Active Problems:   Intentional amitriptyline overdose (HCC)  Subjective Data: Patient denies any suicidal ideation.  Has been under a lot of stress recently.  Feels like she is doing better.  Denies psychotic symptoms.  No homicidal ideation  Continued Clinical Symptoms:  Alcohol Use Disorder Identification Test Final Score (AUDIT): 0 The "Alcohol Use Disorders Identification Test", Guidelines for Use in Primary Care, Second Edition.  World Science writer [facility]). Score between 0-7:  no or low risk or alcohol related problems. Score between 8-15:  moderate risk of alcohol related problems. Score between 16-19:  high risk of alcohol related problems. Score 20 or above:  warrants further diagnostic evaluation for alcohol dependence and treatment.   CLINICAL FACTORS:   Severe Anxiety and/or Agitation   Musculoskeletal: Strength & Muscle Tone: within normal limits Gait & Station: normal Patient leans: N/A  Psychiatric Specialty Exam: Physical Exam  Nursing note and vitals reviewed. Constitutional: She appears well-developed and well-nourished.  HENT:  Head: Normocephalic and atraumatic.  Eyes: Pupils are equal, round, and reactive to light. Conjunctivae are normal.  Neck: Normal range of motion.  Cardiovascular: Normal heart sounds.  Respiratory: Effort normal.   GI: Soft.  Musculoskeletal: Normal range of motion.  Neurological: She is alert.  Skin: Skin is warm and dry.  Psychiatric: She has a normal mood and affect. Her behavior is normal. Judgment and thought content normal.    Review of Systems  Constitutional: Negative.   HENT: Negative.   Eyes: Negative.   Respiratory: Negative.   Cardiovascular: Negative.   Gastrointestinal: Negative.   Musculoskeletal: Negative.   Skin: Negative.   Neurological: Negative.   Psychiatric/Behavioral: Negative.     Blood pressure 118/83, pulse (!) 105, temperature 98.7 F (37.1 C), temperature source Oral, resp. rate 16, height 5\' 3"  (1.6 m), weight 68 kg, last menstrual period 10/07/2015, SpO2 98 %.Body mass index is 26.56 kg/m.  General Appearance: Casual  Eye Contact:  Good  Speech:  Clear and Coherent  Volume:  Normal  Mood:  Euthymic  Affect:  Constricted  Thought Process:  Goal Directed  Orientation:  Full (Time, Place, and Person)  Thought Content:  Logical  Suicidal Thoughts:  No  Homicidal Thoughts:  No  Memory:  Immediate;   Fair Recent;   Fair Remote;   Fair  Judgement:  Fair  Insight:  Fair  Psychomotor Activity:  Normal  Concentration:  Concentration: Fair  Recall:  [employer] of Knowledge:  Fair  Language:  Fair  Akathisia:  No  Handed:  Right  AIMS (if indicated):     Assets:  Desire for Improvement  ADL's:  Intact  Cognition:  WNL  Sleep:  Number of Hours: 8.5      COGNITIVE FEATURES THAT CONTRIBUTE TO RISK:  Loss of executive function    SUICIDE RISK:   Minimal: No identifiable suicidal ideation.  Patients presenting with no risk factors but with morbid ruminations; may be classified as minimal risk  based on the severity of the depressive symptoms  PLAN OF CARE: Patient denies suicidal ideation.  Agrees to outpatient therapy and will be referred to local mental health agencies.  Reassess suicidality at discharge.  I certify that inpatient services furnished  can reasonably be expected to improve the patient's condition.   Mordecai Rasmussen, MD 10/20/2018, 5:30 PM

## 2018-10-20 NOTE — BHH Suicide Risk Assessment (Signed)
BHH INPATIENT:  Family/Significant Other Suicide Prevention Education  Suicide Prevention Education:  Patient Refusal for Family/Significant Other Suicide Prevention Education: The patient Susan Schaefer has refused to provide written consent for family/significant other to be provided Family/Significant Other Suicide Prevention Education during admission and/or prior to discharge.  Physician notified.  SPE completed with pt, as pt refused to consent to family contact. SPI pamphlet provided to pt and pt was encouraged to share information with support network, ask questions, and talk about any concerns relating to SPE. Pt denies access to guns/firearms and verbalized understanding of information provided. Mobile Crisis information also provided to pt.    Charlann Lange Ashutosh Dieguez MSW LCSW 10/20/2018, 10:12 AM

## 2018-10-20 NOTE — Plan of Care (Signed)
Patient is oriented and stable doing well in the unit , states her medications are working good without any side effects , patient is aware of her coping skills able to voice positive attribute of self and voice no concerns,   Sleep is good without interruptions, appetite is good and well hydrated with fluid and juices , contract for safety denies any SI/HI/AVH , 15 minutes rounding is maintained no distress.   Problem: Education: Goal: Knowledge of [facility] Education information/materials will improve Outcome: Progressing Goal: Emotional status will improve Outcome: Progressing Goal: Mental status will improve Outcome: Progressing Goal: Verbalization of understanding the information provided will improve Outcome: Progressing   Problem: Activity: Goal: Interest or engagement in activities will improve Outcome: Progressing Goal: Sleeping patterns will improve Outcome: Progressing   Problem: Health Behavior/Discharge Planning: Goal: Identification of resources available to assist in meeting health care needs will improve Outcome: Progressing   Problem: Self-Concept: Goal: Will verbalize positive feelings about self Outcome: Progressing

## 2018-10-20 NOTE — BHH Group Notes (Signed)
[facility] Group Notes:  (Nursing/MHT/Case Management/Adjunct)  Date:  10/20/2018  Time:  3:12 PM  Type of Therapy:  Psychoeducational Skills  Participation Level:  Did Not Attend   Susan Schaefer 10/20/2018, 3:12 PM

## 2018-10-20 NOTE — H&P (Signed)
Psychiatric Admission Assessment Adult  Patient Identification: Susan Schaefer MRN:  [id number] Date of Evaluation:  10/20/2018 Chief Complaint:  Major Depression Disorder Principal Diagnosis: Adjustment disorder with mixed disturbance of emotions and conduct Diagnosis:  Principal Problem:   Adjustment disorder with mixed disturbance of emotions and conduct Active Problems:   Intentional amitriptyline overdose (HCC)  History of Present Illness: Patient presented to the hospital after taking an overdose of amitriptyline.  It was an impulsive behavior after being told by her fianc that he was breaking up with her.  Patient denies that she had been depressed in the week previous.  Denied any sleep problems or hopelessness.  It was a crushing overwhelming thing to be told by her fianc that he was breaking up with her and then she would have to move out of the house.  Patient says at that time she had suicidal ideation but now denies any suicidal thoughts at all.  Has optimistic plans about the future.  Denies any psychosis.  Denies any homicidal ideation Associated Signs/Symptoms: Depression Symptoms:  depressed mood, (Hypo) Manic Symptoms:  Impulsivity, Anxiety Symptoms:  Excessive Worry, Psychotic Symptoms:  None PTSD Symptoms: Negative Total Time spent with patient: 1 hour  Past Psychiatric History: Patient has no previous hospitalizations no previous suicide attempts.  Has seen counselors in the past.  No history of psychosis.  Is the patient at risk to self? No.  Has the patient been a risk to self in the past 6 months? No.  Has the patient been a risk to self within the distant past? No.  Is the patient a risk to others? No.  Has the patient been a risk to others in the past 6 months? No.  Has the patient been a risk to others within the distant past? No.   Prior Inpatient Therapy:   Prior Outpatient Therapy:    Alcohol Screening: 1. How often do you have a drink containing  alcohol?: Never 2. How many drinks containing alcohol do you have on a typical day when you are drinking?: 1 or 2 3. How often do you have six or more drinks on one occasion?: Never AUDIT-C Score: 0 4. How often during the last year have you found that you were not able to stop drinking once you had started?: Never 5. How often during the last year have you failed to do what was normally expected from you becasue of drinking?: Never 6. How often during the last year have you needed a first drink in the morning to get yourself going after a heavy drinking session?: Never 7. How often during the last year have you had a feeling of guilt of remorse after drinking?: Never 8. How often during the last year have you been unable to remember what happened the night before because you had been drinking?: Never 9. Have you or someone else been injured as a result of your drinking?: No 10. Has a relative or friend or a doctor or another health worker been concerned about your drinking or suggested you cut down?: No Alcohol Use Disorder Identification Test Final Score (AUDIT): 0 Alcohol Brief Interventions/Follow-up: AUDIT Score <7 follow-up not indicated, Alcohol Education Substance Abuse History in the last 12 months:  No. Consequences of Substance Abuse: NA Previous Psychotropic Medications: Yes  Psychological Evaluations: Yes  Past Medical History:  Past Medical History:  Diagnosis Date  . Anxiety   . Depression   . Hypoglycemia     Past Surgical History:  Procedure  Laterality Date  . ANTERIOR CRUCIATE LIGAMENT REPAIR Left   . BILATERAL SALPINGECTOMY Bilateral 01/14/2016   Procedure: BILATERAL SALPINGECTOMY;  Surgeon: Suzy Bouchard, MD;  Location: [facility] ORS;  Service: Gynecology;  Laterality: Bilateral;  . TONSILLECTOMY    . VAGINAL HYSTERECTOMY N/A 01/14/2016   Procedure: HYSTERECTOMY VAGINAL;  Surgeon: Suzy Bouchard, MD;  Location: [facility] ORS;  Service: Gynecology;  Laterality:  N/A;   Family History: History reviewed. No pertinent family history. Family Psychiatric  History: Denies any Tobacco Screening:   Social History:  Social History   Substance and Sexual Activity  Alcohol Use Yes   Comment: every now and then     Social History   Substance and Sexual Activity  Drug Use No    Additional Social History: Marital status: Divorced Divorced, when?: separated from 1st husband in 2012 Additional relationship information: Pt is currently engaged Are you sexually active?: Yes What is your sexual orientation?: heterosexual Has your sexual activity been affected by drugs, alcohol, medication, or emotional stress?: pt denies Does patient have children?: Yes How many children?: 1 How is patient's relationship with their children?: 34 yo daughter, good relationship                         Allergies:   Allergies  Allergen Reactions  . Codeine   . Doxycycline Nausea And Vomiting  . Hydrocodone   . Tramadol    Lab Results:  Results for orders placed or performed during the hospital encounter of 10/18/18 (from the past 48 hour(s))  Glucose, capillary     Status: Abnormal   Collection Time: 10/18/18  7:32 PM  Result Value Ref Range   Glucose-Capillary 109 (H) 70 - 99 mg/dL  CBC with Differential     Status: Abnormal   Collection Time: 10/18/18  7:41 PM  Result Value Ref Range   WBC 11.0 (H) 4.0 - 10.5 K/uL   RBC 4.39 3.87 - 5.11 MIL/uL   Hemoglobin 13.1 12.0 - 15.0 g/dL   HCT 16.1 09.6 - 04.5 %   MCV 92.3 80.0 - 100.0 fL   MCH 29.8 26.0 - 34.0 pg   MCHC 32.3 30.0 - 36.0 g/dL   RDW 40.9 81.1 - 91.4 %   Platelets 235 150 - 400 K/uL   nRBC 0.0 0.0 - 0.2 %   Neutrophils Relative % 90 %   Neutro Abs 9.8 (H) 1.7 - 7.7 K/uL   Lymphocytes Relative 7 %   Lymphs Abs 0.8 0.7 - 4.0 K/uL   Monocytes Relative 2 %   Monocytes Absolute 0.3 0.1 - 1.0 K/uL   Eosinophils Relative 0 %   Eosinophils Absolute 0.0 0.0 - 0.5 K/uL   Basophils Relative 0 %    Basophils Absolute 0.0 0.0 - 0.1 K/uL   Immature Granulocytes 1 %   Abs Immature Granulocytes 0.05 0.00 - 0.07 K/uL    Comment: Performed at [facility], [address] [place], [place] [address]  Comprehensive metabolic panel     Status: Abnormal   Collection Time: 10/18/18  7:41 PM  Result Value Ref Range   Sodium 136 135 - 145 mmol/L   Potassium 4.5 3.5 - 5.1 mmol/L    Comment: HEMOLYSIS AT THIS LEVEL MAY AFFECT RESULT   Chloride 105 98 - 111 mmol/L   CO2 25 22 - 32 mmol/L   Glucose, Bld 117 (H) 70 - 99 mg/dL   BUN 15 6 - 20 mg/dL  Creatinine, Ser 0.78 0.44 - 1.00 mg/dL   Calcium 8.5 (L) 8.9 - 10.3 mg/dL   Total Protein 7.7 6.5 - 8.1 g/dL   Albumin 4.2 3.5 - 5.0 g/dL   AST 16 15 - 41 U/L   ALT 10 0 - 44 U/L   Alkaline Phosphatase 85 38 - 126 U/L   Total Bilirubin 1.0 0.3 - 1.2 mg/dL   GFR calc non Af Amer >60 >60 mL/min   GFR calc Af Amer >60 >60 mL/min   Anion gap 6 5 - 15    Comment: Performed at [facility], [address], [place], [place] [address]  Ethanol     Status: None   Collection Time: 10/18/18  7:41 PM  Result Value Ref Range   Alcohol, Ethyl (B) <10 <10 mg/dL    Comment: (NOTE) Lowest detectable limit for serum alcohol is 10 mg/dL. For medical purposes only. Performed at [facility], [address] [place], [place] [address]   Salicylate level     Status: None   Collection Time: 10/18/18  7:41 PM  Result Value Ref Range   Salicylate Lvl <7.0 2.8 - 30.0 mg/dL    Comment: Performed at [facility], [address] [place], [place] [address]  Protime-INR     Status: None   Collection Time: 10/18/18  7:41 PM  Result Value Ref Range   Prothrombin Time 12.3 11.4 - 15.2 seconds   INR 0.92     Comment: Performed at [facility], [address] [place], [place] [address]  Urinalysis, Complete w Microscopic     Status: Abnormal   Collection Time: 10/18/18  7:41 PM  Result Value Ref Range   Color,  Urine YELLOW (A) YELLOW   APPearance CLEAR (A) CLEAR   Specific Gravity, Urine 1.016 1.005 - 1.030   pH 6.0 5.0 - 8.0   Glucose, UA NEGATIVE NEGATIVE mg/dL   Hgb urine dipstick NEGATIVE NEGATIVE   Bilirubin Urine NEGATIVE NEGATIVE   Ketones, ur 20 (A) NEGATIVE mg/dL   Protein, ur NEGATIVE NEGATIVE mg/dL   Nitrite NEGATIVE NEGATIVE   Leukocytes,Ua NEGATIVE NEGATIVE   RBC / HPF 0-5 0 - 5 RBC/hpf   WBC, UA 0-5 0 - 5 WBC/hpf   Bacteria, UA NONE SEEN NONE SEEN   Squamous Epithelial / LPF 0-5 0 - 5   Mucus PRESENT    Hyaline Casts, UA PRESENT     Comment: Performed at [facility], [address] [place], [place] [address]  Urine Drug Screen, Qualitative     Status: Abnormal   Collection Time: 10/18/18  7:41 PM  Result Value Ref Range   Tricyclic, Ur Screen POSITIVE (A) NONE DETECTED   Amphetamines, Ur Screen NONE DETECTED NONE DETECTED   MDMA (Ecstasy)Ur Screen NONE DETECTED NONE DETECTED   Cocaine Metabolite,Ur [place] NONE DETECTED NONE DETECTED   Opiate, Ur Screen NONE DETECTED NONE DETECTED   Phencyclidine (PCP) Ur S NONE DETECTED NONE DETECTED   Cannabinoid 50 Ng, Ur  NONE DETECTED NONE DETECTED   Barbiturates, Ur Screen NONE DETECTED NONE DETECTED   Benzodiazepine, Ur Scrn NONE DETECTED NONE DETECTED   Methadone Scn, Ur NONE DETECTED NONE DETECTED    Comment: (NOTE) Tricyclics + metabolites, urine    Cutoff 1000 ng/mL Amphetamines + metabolites, urine  Cutoff 1000 ng/mL MDMA (Ecstasy), urine              Cutoff 500 ng/mL Cocaine Metabolite, urine  Cutoff 300 ng/mL Opiate + metabolites, urine        Cutoff 300 ng/mL Phencyclidine (PCP), urine         Cutoff 25 ng/mL Cannabinoid, urine                 Cutoff 50 ng/mL Barbiturates + metabolites, urine  Cutoff 200 ng/mL Benzodiazepine, urine              Cutoff 200 ng/mL Methadone, urine                   Cutoff 300 ng/mL The urine drug screen provides only a preliminary, unconfirmed analytical test result and  should not be used for non-medical purposes. Clinical consideration and professional judgment should be applied to any positive drug screen result due to possible interfering substances. A more specific alternate chemical method must be used in order to obtain a confirmed analytical result. Gas chromatography / mass spectrometry (GC/MS) is the preferred confirmat ory method. Performed at [facility], [address] [place], [place] [address]   Acetaminophen level     Status: Abnormal   Collection Time: 10/18/18  7:41 PM  Result Value Ref Range   Acetaminophen (Tylenol), Serum <10 (L) 10 - 30 ug/mL    Comment: (NOTE) Therapeutic concentrations vary significantly. A range of 10-30 ug/mL  may be an effective concentration for many patients. However, some  are best treated at concentrations outside of this range. Acetaminophen concentrations >150 ug/mL at 4 hours after ingestion  and >50 ug/mL at 12 hours after ingestion are often associated with  toxic reactions. Performed at [facility], [address] [place], [place] [address]   Magnesium     Status: None   Collection Time: 10/18/18  7:41 PM  Result Value Ref Range   Magnesium 2.0 1.7 - 2.4 mg/dL    Comment: Performed at [facility], [address] [place], [place] [address]    Blood Alcohol level:  Lab Results  Component Value Date   [facility]10 10/18/2018    Metabolic Disorder Labs:  No results found for: HGBA1C, MPG No results found for: PROLACTIN No results found for: CHOL, TRIG, HDL, CHOLHDL, VLDL, LDLCALC  Current Medications: Current Facility-Administered Medications  Medication Dose Route Frequency Provider Last Rate Last Dose  . acetaminophen (TYLENOL) tablet 650 mg  650 mg Oral Q6H PRN Catalina Gravelhomspon, Jacqueline, NP      . alum & mag hydroxide-simeth (MAALOX/MYLANTA) 200-200-20 MG/5ML suspension 30 mL  30 mL Oral Q4H PRN Catalina Gravelhomspon, Jacqueline, NP      . escitalopram (LEXAPRO) tablet  10 mg  10 mg Oral Daily Catori Panozzo T, MD   10 mg at 10/20/18 1505  . hydrOXYzine (ATARAX/VISTARIL) tablet 25 mg  25 mg Oral TID PRN Catalina Gravelhomspon, Jacqueline, NP      . magnesium hydroxide (MILK OF MAGNESIA) suspension 30 mL  30 mL Oral Daily PRN Catalina Gravelhomspon, Jacqueline, NP       PTA Medications: Medications Prior to Admission  Medication Sig Dispense Refill Last Dose  . amitriptyline (ELAVIL) 25 MG tablet Take 1-2 tablets by mouth at bedtime as needed.   10/18/2018 at Unknown time    Musculoskeletal: Strength & Muscle Tone: within normal limits Gait & Station: normal Patient leans: N/A  Psychiatric Specialty Exam: Physical Exam  Nursing note and vitals reviewed. Constitutional: She appears well-developed and well-nourished.  HENT:  Head: Normocephalic and atraumatic.  Eyes: Pupils are equal, round, and reactive to light. Conjunctivae are  normal.  Neck: Normal range of motion.  Cardiovascular: Normal heart sounds.  Respiratory: Effort normal.  GI: Soft.  Musculoskeletal: Normal range of motion.  Neurological: She is alert.  Skin: Skin is warm and dry.  Psychiatric: She has a normal mood and affect. Her behavior is normal. Judgment and thought content normal.    Review of Systems  Constitutional: Negative.   HENT: Negative.   Eyes: Negative.   Respiratory: Negative.   Cardiovascular: Negative.   Gastrointestinal: Negative.   Musculoskeletal: Negative.   Skin: Negative.   Neurological: Negative.   Psychiatric/Behavioral: Negative.     Blood pressure 118/83, pulse (!) 105, temperature 98.7 F (37.1 C), temperature source Oral, resp. rate 16, height 5\' 3"  (1.6 m), weight 68 kg, last menstrual period 10/07/2015, SpO2 98 %.Body mass index is 26.56 kg/m.  General Appearance: Casual  Eye Contact:  Good  Speech:  Clear and Coherent  Volume:  Normal  Mood:  Euthymic  Affect:  Congruent  Thought Process:  Coherent  Orientation:  Full (Time, Place, and Person)  Thought Content:   Logical  Suicidal Thoughts:  No  Homicidal Thoughts:  No  Memory:  Immediate;   Fair Recent;   Fair Remote;   Fair  Judgement:  Fair  Insight:  Negative  Psychomotor Activity:  Normal  Concentration:  Concentration: Fair  Recall:  Fis[employer]Fair  Fund of Knowledge:  Fair  Language:  Fair  Akathisia:  Negative  Handed:  Right  AIMS (if indicated):     Assets:  Communication Skills  ADL's:  Intact  Cognition:  WNL  Sleep:  Number of Hours: 8.5    Treatment Plan Summary: Plan Patient no longer needs inpatient hospitalization.  Counseling completed.  Restarted antidepressant medicine based on previous usage in the past that she had found helpful.  Patient is likely to be discharged tomorrow with follow-up in the community  Observation Level/Precautions:  15 minute checks  Laboratory:  UDS  Psychotherapy:    Medications:    Consultations:    Discharge Concerns:    Estimated LOS:  Other:     Physician Treatment Plan for Primary Diagnosis: Adjustment disorder with mixed disturbance of emotions and conduct Long Term Goal(s): Improvement in symptoms so as ready for discharge  Short Term Goals: Ability to verbalize feelings will improve  Physician Treatment Plan for Secondary Diagnosis: Principal Problem:   Adjustment disorder with mixed disturbance of emotions and conduct Active Problems:   Intentional amitriptyline overdose (HCC)  Long Term Goal(s): Improvement in symptoms so as ready for discharge  Short Term Goals: Compliance with prescribed medications will improve  I certify that inpatient services furnished can reasonably be expected to improve the patient's condition.    Mordecai RasmussenJohn Nayib Remer, MD 2/19/20205:34 PM

## 2018-10-20 NOTE — Plan of Care (Signed)
Patient instructed on unit programing , able to verbalize understanding Emotional and mental status  improving, Attending  unit programing  able to verbalize  feelings  Problem: Education: Goal: Knowledge of [facility] Education information/materials will improve Outcome: Progressing Goal: Emotional status will improve Outcome: Progressing Goal: Mental status will improve Outcome: Progressing Goal: Verbalization of understanding the information provided will improve Outcome: Progressing   Problem: Activity: Goal: Interest or engagement in activities will improve Outcome: Progressing Goal: Sleeping patterns will improve Outcome: Progressing   Problem: Health Behavior/Discharge Planning: Goal: Identification of resources available to assist in meeting health care needs will improve Outcome: Progressing   Problem: Self-Concept: Goal: Will verbalize positive feelings about self Outcome: Progressing  . Interacting  with peers . Voice no concerns around  sleep.

## 2018-10-21 NOTE — Progress Notes (Signed)
Recreation Therapy Notes  Date: 10/21/2018  Time: 9:30 am   Location: Craft room   Behavioral response: N/A   Intervention Topic: Communication  Discussion/Intervention: Patient did not attend group.   Clinical Observations/Feedback:  Patient did not attend group.   Meztli Llanas LRT/CTRS        Illias Pantano 10/21/2018 11:00 AM

## 2018-10-21 NOTE — Progress Notes (Signed)
  [facility] Adult Case Management Discharge Plan :  Will you be returning to the same living situation after discharge:  Yes,  home At discharge, do you have transportation home?: Yes,  bf picking her up Do you have the ability to pay for your medications: Yes,  mental health  Release of information consent forms completed and in the chart;   Patient to Follow up at: Follow-up Information    [facility] Follow up on 10/25/2018.   Why:  Meet Unk Pinto on Monday 10/25/2018 at 7:15AM at [facility] for peer support services. Thank you! Contact information: [address] [place] [address] [phone]           Next level of care provider has access to [facility] Link:no  Safety Planning and Suicide Prevention discussed: Yes,  SPE completed with pt as pt declined collateral contact     Has patient been referred to the Quitline?: N/A patient is not a smoker  Patient has been referred for addiction treatment: Yes  Mechele Dawley, LCSW 10/21/2018, 9:31 AM

## 2018-10-21 NOTE — Progress Notes (Signed)
D: Patient is aware of  Discharge this shift .Patient denies suicidal /homicidal ideations. Patient received all belongings brought in   A: No Storage medications. Writer reviewed Discharge Summary, Suicide Risk Assessment, and Transitional Record. Patient also received Prescriptions   from  MD.  Aware  Of follow up appointment . R: Patient left unit with no questions  Or concerns  With  Friend

## 2018-10-21 NOTE — Discharge Summary (Signed)
Physician Discharge Summary Note  Patient:  Susan CooterHeather M Kardell is an 48 y.o., female MRN:  [id number] DOB:  [birth year] Patient phone:  [phone] (home)  Patient address:   [address] [place] [id number],  Total Time spent with patient: 45 minutes  Date of Admission:  10/19/2018 Date of Discharge: October 29, 2018  Reason for Admission: Patient was admitted to the hospital because of an intentional overdose on prescription medicine  Principal Problem: Adjustment disorder with mixed disturbance of emotions and conduct Discharge Diagnoses: Principal Problem:   Adjustment disorder with mixed disturbance of emotions and conduct Active Problems:   Intentional amitriptyline overdose [facility](HCC)   Past Psychiatric History: Patient has some past history of depression years ago.  Denies past suicidal behavior.  No history known of psychosis.  Past Medical History:  Past Medical History:  Diagnosis Date  . Anxiety   . Depression   . Hypoglycemia     Past Surgical History:  Procedure Laterality Date  . ANTERIOR CRUCIATE LIGAMENT REPAIR Left   . BILATERAL SALPINGECTOMY Bilateral 01/14/2016   Procedure: BILATERAL SALPINGECTOMY;  Surgeon: Suzy Bouchardhomas J Schermerhorn, MD;  Location: [facility] ORS;  Service: Gynecology;  Laterality: Bilateral;  . TONSILLECTOMY    . VAGINAL HYSTERECTOMY N/A 01/14/2016   Procedure: HYSTERECTOMY VAGINAL;  Surgeon: Suzy Bouchardhomas J Schermerhorn, MD;  Location: [facility] ORS;  Service: Gynecology;  Laterality: N/A;   Family History: History reviewed. No pertinent family history. Family Psychiatric  History: None known Social History:  Social History   Substance and Sexual Activity  Alcohol Use Yes   Comment: every now and then     Social History   Substance and Sexual Activity  Drug Use No    Social History   Socioeconomic History  . Marital status: Divorced    Spouse name: Not on file  . Number of children: Not on file  . Years of education: Not on file  . Highest  education level: Not on file  Occupational History  . Not on file  Social Needs  . Financial resource strain: Not on file  . Food insecurity:    Worry: Not on file    Inability: Not on file  . Transportation needs:    Medical: Not on file    Non-medical: Not on file  Tobacco Use  . Smoking status: Former Smoker    Packs/day: 1.00    Years: 27.00    Pack years: 27.00    Types: Cigarettes  . Smokeless tobacco: Never Used  Substance and Sexual Activity  . Alcohol use: Yes    Comment: every now and then  . Drug use: No  . Sexual activity: Not on file  Lifestyle  . Physical activity:    Days per week: Not on file    Minutes per session: Not on file  . Stress: Not on file  Relationships  . Social connections:    Talks on phone: Not on file    Gets together: Not on file    Attends religious service: Not on file    Active member of club or organization: Not on file    Attends meetings of clubs or organizations: Not on file    Relationship status: Not on file  Other Topics Concern  . Not on file  Social History Narrative  . Not on file    Hospital Course: Patient was admitted to the hospital after stabilizing on the medical floor.  She was transferred to psychiatry.  15-minute checks were maintained.  Patient  was cooperative with treatment and forthcoming in the interview.  She consistently denied any suicidal ideation in the hospital.  Patient was able to show some insight into the dangerousness of her behavior.  She denied that she had regular substance abuse problems.  Patient was agreeable to the suggestion of restarting antidepressant medicine that had been useful to her years ago when she took Lexapro.  At the time of discharge no longer meeting any commitment criteria.  Behavior calm and appropriate.  Agreeable to following up at local mental health agencies.  Prescriptions supplied for medication  Physical Findings: AIMS:  , ,  ,  ,    CIWA:    COWS:      Musculoskeletal: Strength & Muscle Tone: within normal limits Gait & Station: normal Patient leans: N/A  Psychiatric Specialty Exam: Physical Exam  Nursing note and vitals reviewed. Constitutional: She appears well-developed and well-nourished.  HENT:  Head: Normocephalic and atraumatic.  Eyes: Pupils are equal, round, and reactive to light. Conjunctivae are normal.  Neck: Normal range of motion.  Cardiovascular: Regular rhythm and normal heart sounds.  Respiratory: Effort normal. No respiratory distress.  GI: Soft.  Musculoskeletal: Normal range of motion.  Neurological: She is alert.  Skin: Skin is warm and dry.  Psychiatric: She has a normal mood and affect. Her behavior is normal. Judgment and thought content normal.    Review of Systems  Constitutional: Negative.   HENT: Negative.   Eyes: Negative.   Respiratory: Negative.   Cardiovascular: Negative.   Gastrointestinal: Negative.   Musculoskeletal: Negative.   Skin: Negative.   Neurological: Negative.   Psychiatric/Behavioral: Negative.     Blood pressure 107/71, pulse (!) 106, temperature 98.2 F (36.8 C), temperature source Oral, resp. rate 16, height 5\' 3"  (1.6 m), weight 68 kg, last menstrual period 10/07/2015, SpO2 99 %.Body mass index is 26.56 kg/m.  General Appearance: Casual  Eye Contact:  Good  Speech:  Clear and Coherent  Volume:  Normal  Mood:  Euthymic  Affect:  Congruent  Thought Process:  Goal Directed  Orientation:  Full (Time, Place, and Person)  Thought Content:  Logical  Suicidal Thoughts:  No  Homicidal Thoughts:  No  Memory:  Immediate;   Fair Recent;   Fair Remote;   Fair  Judgement:  Fair  Insight:  Fair  Psychomotor Activity:  Decreased  Concentration:  Concentration: Fair  Recall:  [employer] of Knowledge:  Fair  Language:  Fair  Akathisia:  No  Handed:  Right  AIMS (if indicated):     Assets:  Desire for Improvement Housing Physical Health Resilience  ADL's:  Intact   Cognition:  WNL  Sleep:  Number of Hours: 7.3        Has this patient used any form of tobacco in the last 30 days? (Cigarettes, Smokeless Tobacco, Cigars, and/or Pipes) Yes, No  Blood Alcohol level:  Lab Results  Component Value Date   ETH <10 10/18/2018    Metabolic Disorder Labs:  No results found for: HGBA1C, MPG No results found for: PROLACTIN No results found for: CHOL, TRIG, HDL, CHOLHDL, VLDL, LDLCALC  See Psychiatric Specialty Exam and Suicide Risk Assessment completed by Attending Physician prior to discharge.  Discharge destination:  Home  Is patient on multiple antipsychotic therapies at discharge:  No   Has Patient had three or more failed trials of antipsychotic monotherapy by history:  No  Recommended Plan for Multiple Antipsychotic Therapies: NA  Discharge Instructions  Diet - low sodium heart healthy   Complete by:  As directed    Increase activity slowly   Complete by:  As directed      Allergies as of 10/21/2018      Reactions   Codeine    Doxycycline Nausea And Vomiting   Hydrocodone    Tramadol       Medication List    STOP taking these medications   amitriptyline 25 MG tablet Commonly known as:  ELAVIL     TAKE these medications     Indication  escitalopram 10 MG tablet Commonly known as:  LEXAPRO Take 1 tablet (10 mg total) by mouth daily.  Indication:  Major Depressive Disorder      Follow-up Information    [facility] Follow up on 10/25/2018.   Why:  Meet Unk Pinto on Monday 10/25/2018 at 7:15AM at [facility] for peer support services. Thank you! Contact information: [address] [place] [address] [phone]           Follow-up recommendations:  Activity:  Activity as tolerated Diet:  Regular diet Other:  Follow-up with [facility] at appointment time scheduled continue current medication.  Suggest discontinuing amitriptyline.  Comments: Patient was appropriate in her interactions.  Appeared to  have good insight.  At the time of discharge did not appear to be at elevated risk of suicide that would justify forced hospitalization  Signed: Mordecai Rasmussen, MD 10/21/2018, 3:29 PM

## 2019-05-16 DIAGNOSIS — F419 Anxiety disorder, unspecified: Secondary | ICD-10-CM | POA: Insufficient documentation

## 2019-05-16 DIAGNOSIS — F32A Depression, unspecified: Secondary | ICD-10-CM | POA: Insufficient documentation

## 2020-05-19 ENCOUNTER — Other Ambulatory Visit: Payer: Self-pay

## 2020-05-19 ENCOUNTER — Ambulatory Visit
Admission: RE | Admit: 2020-05-19 | Discharge: 2020-05-19 | Disposition: A | Discharge status: Home / Self Care | Payer: BC Managed Care – PPO | Source: Ambulatory Visit | Attending: Emergency Medicine | Admitting: Emergency Medicine

## 2020-05-19 VITALS — BP 111/79 | HR 84 | Temp 98.9°F | Resp 18 | Ht 63.0 in | Wt 149.9 lb

## 2020-05-19 DIAGNOSIS — Z20822 Contact with and (suspected) exposure to covid-19: Secondary | ICD-10-CM

## 2020-05-19 DIAGNOSIS — R05 Cough: Secondary | ICD-10-CM

## 2020-05-19 DIAGNOSIS — R059 Cough, unspecified: Secondary | ICD-10-CM

## 2020-05-19 DIAGNOSIS — U071 COVID-19: Secondary | ICD-10-CM | POA: Insufficient documentation

## 2020-05-19 MED ORDER — BENZONATATE 100 MG PO CAPS: 100.0000 mg | ORAL_CAPSULE | Freq: Three times a day (TID) | ORAL | 0 refills | Status: DC | PRN

## 2020-05-19 MED ORDER — PREDNISONE 20 MG PO TABS: 40.0000 mg | ORAL_TABLET | Freq: Every day | ORAL | 0 refills | Status: AC

## 2020-05-19 MED ORDER — ALBUTEROL SULFATE HFA 108 (90 BASE) MCG/ACT IN AERS: 1.0000 | INHALATION_SPRAY | Freq: Four times a day (QID) | RESPIRATORY_TRACT | 0 refills | Status: AC | PRN

## 2020-05-19 NOTE — Discharge Instructions (Signed)
I am concerned about covid-19 so please isolate until your results are back. I do feel this is viral, even if negative testing, without obvious indication for antibiotics today.   Will notify you by phone of any positive findings. Your negative results will be sent through your MyChart.     Push fluids to ensure adequate hydration and keep secretions thin.  Tylenol and/or ibuprofen as needed for pain or fevers.  You may try some vitamins to help your immune system potentially:  Vitamin C 500mg  twice a day. Zinc 50mg  daily. Vitamin D 5000IU daily.   Use of inhaler as needed for wheezing or shortness of breath.   Course of prednisone.  Tessalon as needed for cough.  If symptoms worsen or do not improve in the next week to return to be seen or to follow up with your PCP.

## 2020-05-19 NOTE — ED Triage Notes (Signed)
Pt c/o cough, nasal congestion, "lung pain". Denies fever. Started about 5 days ago. She states her daughter tested positive about a week ago and she has had 2 rapid negative test.

## 2020-05-19 NOTE — ED Provider Notes (Signed)
[facility]    CSN: [id number] Arrival date & time: 05/19/20  1039      History   Chief Complaint Chief Complaint  Patient presents with  . Appointment  . Cough    HPI Susan Schaefer is a 49 y.o. female.   Susan Schaefer presents with complaints of cough, chest pain with deep breathing. Symptoms started 9/14. Headache started 9/13, however, which has since resolved. No congestion, no nasal drainage. Cough is dry. No dizziness. No shortness of breath. No leg pain or swelling. Diarrhea which started yesterday. Daughter had covid- tested positive 9/7. No history of covid-19 and has not received vaccination. No loss of taste or smell. Doesn't smoke. No asthma history.    ROS per HPI, negative if not otherwise mentioned.      Past Medical History:  Diagnosis Date  . Anxiety   . Depression   . Hypoglycemia     Patient Active Problem List   Diagnosis Date Noted  . Adjustment disorder with mixed disturbance of emotions and conduct 10/20/2018  . Intentional amitriptyline overdose (HCC) 10/19/2018  . Intentional drug overdose (HCC)   . Postoperative state 01/14/2016    Past Surgical History:  Procedure Laterality Date  . ANTERIOR CRUCIATE LIGAMENT REPAIR Left   . BILATERAL SALPINGECTOMY Bilateral 01/14/2016   Procedure: BILATERAL SALPINGECTOMY;  Surgeon: Suzy Bouchard, MD;  Location: [facility] ORS;  Service: Gynecology;  Laterality: Bilateral;  . TONSILLECTOMY    . VAGINAL HYSTERECTOMY N/A 01/14/2016   Procedure: HYSTERECTOMY VAGINAL;  Surgeon: Suzy Bouchard, MD;  Location: [facility] ORS;  Service: Gynecology;  Laterality: N/A;    OB History    Gravida  1   Para      Term      Preterm      AB      Living  1     SAB      TAB      Ectopic      Multiple      Live Births               Home Medications    Prior to Admission medications   Medication Sig Start Date End Date Taking? Authorizing Provider  albuterol (PROAIR  HFA) 108 (90 Base) MCG/ACT inhaler Inhale 1-2 puffs into the lungs every 6 (six) hours as needed for wheezing or shortness of breath. 05/19/20   Georgetta Haber, NP  benzonatate (TESSALON) 100 MG capsule Take 1-2 capsules (100-200 mg total) by mouth 3 (three) times daily as needed for cough. 05/19/20   Georgetta Haber, NP  escitalopram (LEXAPRO) 10 MG tablet Take 1 tablet (10 mg total) by mouth daily. 10/21/18   Clapacs, Jackquline Denmark, MD  predniSONE (DELTASONE) 20 MG tablet Take 2 tablets (40 mg total) by mouth daily with breakfast for 5 days. 05/19/20 05/24/20  Georgetta Haber, NP    Family History History reviewed. No pertinent family history.  Social History Social History   Tobacco Use  . Smoking status: Former Smoker    Packs/day: 1.00    Years: 27.00    Pack years: 27.00    Types: Cigarettes  . Smokeless tobacco: Never Used  Vaping Use  . Vaping Use: Former  Substance Use Topics  . Alcohol use: Yes    Comment: every now and then  . Drug use: No     Allergies   Codeine, Doxycycline, Hydrocodone, and Tramadol   Review of Systems Review of Systems  Physical Exam Triage Vital Signs ED Triage Vitals  Enc Vitals Group     BP 05/19/20 1059 111/79     Pulse Rate 05/19/20 1059 84     Resp 05/19/20 1059 18     Temp 05/19/20 1059 98.9 F (37.2 C)     Temp Source 05/19/20 1059 Oral     SpO2 05/19/20 1059 98 %     Weight 05/19/20 1056 149 lb 14.6 oz (68 kg)     Height 05/19/20 1056 5\' 3"  (1.6 m)     Head Circumference --      Peak Flow --      Pain Score 05/19/20 1056 5     Pain Loc --      Pain Edu? --      Excl. in GC? --    No data found.  Updated Vital Signs BP 111/79 (BP Location: Left Arm)   Pulse 84   Temp 98.9 F (37.2 C) (Oral)   Resp 18   Ht 5\' 3"  (1.6 m)   Wt 149 lb 14.6 oz (68 kg)   LMP 10/07/2015 (Exact Date)   SpO2 98%   BMI 26.56 kg/m   Visual Acuity Right Eye Distance:   Left Eye Distance:   Bilateral Distance:    Right Eye Near:   Left  Eye Near:    Bilateral Near:     Physical Exam Constitutional:      General: She is not in acute distress.    Appearance: She is well-developed.  Cardiovascular:     Rate and Rhythm: Normal rate.  Pulmonary:     Effort: Pulmonary effort is normal.  Skin:    General: Skin is warm and dry.  Neurological:     Mental Status: She is alert and oriented to person, place, and time.      UC Treatments / Results  Labs (all labs ordered are listed, but only abnormal results are displayed) Labs Reviewed  SARS CORONAVIRUS 2 (TAT 6-24 HRS)    EKG   Radiology No results found.  Procedures Procedures (including critical care time)  Medications Ordered in UC Medications - No data to display  Initial Impression / Assessment and Plan / UC Course  I have reviewed the triage vital signs and the nursing notes.  Pertinent labs & imaging results that were available during my care of the patient were reviewed by me and considered in my medical decision making (see chart for details).     No work of breathing. Cough, diarrhea. Close contact with covid- her daughter. PCR testing collected and pending. Supportive cares recommended. Return precautions provided. Patient verbalized understanding and agreeable to plan.   Final Clinical Impressions(s) / UC Diagnoses   Final diagnoses:  Cough  Exposure to COVID-19 virus     Discharge Instructions     I am concerned about covid-19 so please isolate until your results are back. I do feel this is viral, even if negative testing, without obvious indication for antibiotics today.   Will notify you by phone of any positive findings. Your negative results will be sent through your MyChart.     Push fluids to ensure adequate hydration and keep secretions thin.  Tylenol and/or ibuprofen as needed for pain or fevers.  You may try some vitamins to help your immune system potentially:  Vitamin C 500mg  twice a day. Zinc 50mg  daily. Vitamin D 5000IU  daily.   Use of inhaler as needed for wheezing or shortness of breath.  Course of prednisone.  Tessalon as needed for cough.  If symptoms worsen or do not improve in the next week to return to be seen or to follow up with your PCP.       ED Prescriptions    Medication Sig Dispense Auth. Provider   benzonatate (TESSALON) 100 MG capsule Take 1-2 capsules (100-200 mg total) by mouth 3 (three) times daily as needed for cough. 21 capsule Linus Mako B, NP   albuterol (PROAIR HFA) 108 (90 Base) MCG/ACT inhaler Inhale 1-2 puffs into the lungs every 6 (six) hours as needed for wheezing or shortness of breath. 1 each Georgetta Haber, NP   predniSONE (DELTASONE) 20 MG tablet Take 2 tablets (40 mg total) by mouth daily with breakfast for 5 days. 10 tablet Georgetta Haber, NP     PDMP not reviewed this encounter.   Georgetta Haber, NP 05/19/20 1210

## 2020-05-20 LAB — SARS CORONAVIRUS 2 (TAT 6-24 HRS): SARS Coronavirus 2: POSITIVE — AB

## 2020-05-21 ENCOUNTER — Telehealth: Payer: Self-pay | Admitting: Physician Assistant

## 2020-05-21 ENCOUNTER — Other Ambulatory Visit: Payer: Self-pay | Admitting: Physician Assistant

## 2020-05-21 DIAGNOSIS — U071 COVID-19: Secondary | ICD-10-CM

## 2020-05-21 NOTE — Progress Notes (Signed)
I connected by phone with Susan Schaefer on 05/21/2020 at 3:07 PM to discuss the potential use of a new treatment for mild to moderate COVID-19 viral infection in non-hospitalized patients.  This patient is a 50 y.o. female that meets the FDA criteria for Emergency Use Authorization of COVID monoclonal antibody casirivimab/imdevimab.  Has a (+) direct SARS-CoV-2 viral test result  Has mild or moderate COVID-19   Is NOT hospitalized due to COVID-19  Is within 10 days of symptom onset  Has at least one of the high risk factor(s) for progression to severe COVID-19 and/or hospitalization as defined in EUA.  Specific high risk criteria : BMI > 25   I have spoken and communicated the following to the patient or parent/caregiver regarding COVID monoclonal antibody treatment:  1. FDA has authorized the emergency use for the treatment of mild to moderate COVID-19 in adults and pediatric patients with positive results of direct SARS-CoV-2 viral testing who are 53 years of age and older weighing at least 40 kg, and who are at high risk for progressing to severe COVID-19 and/or hospitalization.  2. The significant known and potential risks and benefits of COVID monoclonal antibody, and the extent to which such potential risks and benefits are unknown.  3. Information on available alternative treatments and the risks and benefits of those alternatives, including clinical trials.  4. Patients treated with COVID monoclonal antibody should continue to self-isolate and use infection control measures (e.g., wear mask, isolate, social distance, avoid sharing personal items, clean and disinfect "high touch" surfaces, and frequent handwashing) according to CDC guidelines.   5. The patient or parent/caregiver has the option to accept or refuse COVID monoclonal antibody treatment.  After reviewing this information with the patient, The patient agreed to proceed with receiving casirivimab\imdevimab infusion  and will be provided a copy of the Fact sheet prior to receiving the infusion. Susan Schaefer 05/21/2020 3:07 PM

## 2020-05-21 NOTE — Telephone Encounter (Signed)
  Called to discuss with patient about Covid symptoms and the use of casirivimab/imdevimab, a monoclonal antibody infusion for those with mild to moderate Covid symptoms and at a high risk of hospitalization.    Message left to call back our hotline [phone] and sent my chart message.   Kolston Lacount, PA - C 

## 2020-05-22 ENCOUNTER — Ambulatory Visit (HOSPITAL_COMMUNITY)
Admission: RE | Admit: 2020-05-22 | Discharge: 2020-05-22 | Disposition: A | Discharge status: Home / Self Care | Payer: BC Managed Care – PPO | Source: Ambulatory Visit | Attending: Pulmonary Disease | Admitting: Pulmonary Disease

## 2020-05-22 DIAGNOSIS — U071 COVID-19: Secondary | ICD-10-CM | POA: Diagnosis present

## 2020-05-22 MED ORDER — DIPHENHYDRAMINE HCL 50 MG/ML IJ SOLN: 50.0000 mg | Freq: Once | INTRAMUSCULAR | Status: DC | PRN

## 2020-05-22 MED ORDER — ALBUTEROL SULFATE HFA 108 (90 BASE) MCG/ACT IN AERS: 2.0000 | INHALATION_SPRAY | Freq: Once | RESPIRATORY_TRACT | Status: DC | PRN

## 2020-05-22 MED ORDER — FAMOTIDINE IN NACL 20-0.9 MG/50ML-% IV SOLN: 20.0000 mg | Freq: Once | INTRAVENOUS | Status: DC | PRN

## 2020-05-22 MED ORDER — METHYLPREDNISOLONE SODIUM SUCC 125 MG IJ SOLR: 125.0000 mg | Freq: Once | INTRAMUSCULAR | Status: DC | PRN

## 2020-05-22 MED ORDER — EPINEPHRINE 0.3 MG/0.3ML IJ SOAJ: 0.3000 mg | Freq: Once | INTRAMUSCULAR | Status: DC | PRN

## 2020-05-22 MED ORDER — SODIUM CHLORIDE 0.9 % IV SOLN
1200.0000 mg | Freq: Once | INTRAVENOUS | Status: AC
  Administered 2020-05-22: 1200 mg via INTRAVENOUS

## 2020-05-22 MED ORDER — SODIUM CHLORIDE 0.9 % IV SOLN: INTRAVENOUS | Status: DC | PRN

## 2020-05-22 NOTE — Progress Notes (Signed)
  Diagnosis: COVID-19  Physician:Dr Wright   Procedure: [facility] Infusion [facility] Med: casirivimab\imdevimab infusion - Provided patient with casirivimab\imdevimab fact sheet for patients, parents and caregivers prior to infusion.  Complications: No immediate complications noted.  Discharge: Discharged home   Larene Ascencio W 05/22/2020  

## 2020-05-22 NOTE — Discharge Instructions (Signed)

## 2020-05-25 ENCOUNTER — Other Ambulatory Visit: Payer: Self-pay

## 2020-05-25 ENCOUNTER — Ambulatory Visit
Admission: RE | Admit: 2020-05-25 | Discharge: 2020-05-25 | Disposition: A | Discharge status: Home / Self Care | Payer: BC Managed Care – PPO | Source: Ambulatory Visit | Attending: Emergency Medicine | Admitting: Emergency Medicine

## 2020-05-25 VITALS — BP 91/63 | HR 89 | Temp 98.3°F | Resp 14 | Ht 63.5 in | Wt 180.0 lb

## 2020-05-25 DIAGNOSIS — H66001 Acute suppurative otitis media without spontaneous rupture of ear drum, right ear: Secondary | ICD-10-CM

## 2020-05-25 MED ORDER — AMOXICILLIN-POT CLAVULANATE 875-125 MG PO TABS: 1.0000 | ORAL_TABLET | Freq: Two times a day (BID) | ORAL | 0 refills | Status: AC

## 2020-05-25 MED ORDER — FLUTICASONE PROPIONATE 50 MCG/ACT NA SUSP: 1.0000 | Freq: Every day | NASAL | 0 refills | Status: AC

## 2020-05-25 NOTE — ED Triage Notes (Signed)
Patient c/o left ear pain that started yesterday.  Patient denies fevers.  

## 2020-05-25 NOTE — ED Provider Notes (Signed)
[facility]    CSN: [id number] Arrival date & time: 05/25/20  1252      History   Chief Complaint Chief Complaint  Patient presents with   Otalgia    COVID +    HPI Susan Schaefer is a 49 y.o. female.   [name] presents with complaints of left ear pain. Recent Covid-19 infection, received MOA infusion 9/21. Never had fevers with this. She overall feels improved, however her left ear started hurting 2-3 days ago and has been severe. No drainage. No fevers. Still with some congestion. Other covid symptoms have improved.     ROS per HPI, negative if not otherwise mentioned.      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   Intentional amitriptyline overdose (HCC) 10/19/2018   Intentional drug overdose (HCC)    Postoperative state 01/14/2016    Past Surgical History:  Procedure Laterality Date   ANTERIOR CRUCIATE LIGAMENT REPAIR Left    BILATERAL SALPINGECTOMY Bilateral 01/14/2016   Procedure: BILATERAL SALPINGECTOMY;  Surgeon: Suzy Bouchard, MD;  Location: [facility];  Service: Gynecology;  Laterality: Bilateral;   TONSILLECTOMY     VAGINAL HYSTERECTOMY N/A 01/14/2016   Procedure: HYSTERECTOMY VAGINAL;  Surgeon: Suzy Bouchard, MD;  Location: [facility];  Service: Gynecology;  Laterality: N/A;    OB History    Gravida  1   Para      Term      Preterm      AB      Living  1     SAB      TAB      Ectopic      Multiple      Live Births               Home Medications    Prior to Admission medications   Medication Sig Start Date End Date Taking? Authorizing Provider  albuterol (PROAIR HFA) 108 (90 Base) MCG/ACT inhaler Inhale 1-2 puffs into the lungs every 6 (six) hours as needed for wheezing or shortness of breath.  05/19/20   Georgetta Haber, NP  amoxicillin-clavulanate (AUGMENTIN) 875-125 MG tablet Take 1 tablet by mouth every 12 (twelve) hours for 5 days. 05/25/20 05/30/20  Georgetta Haber, NP  benzonatate (TESSALON) 100 MG capsule Take 1-2 capsules (100-200 mg total) by mouth 3 (three) times daily as needed for cough. 05/19/20   Georgetta Haber, NP  escitalopram (LEXAPRO) 10 MG tablet Take 1 tablet (10 mg total) by mouth daily. 10/21/18   Clapacs, Jackquline Denmark, MD  fluticasone (FLONASE) 50 MCG/ACT nasal spray Place 1 spray into both nostrils daily. 05/25/20   Georgetta Haber, NP    Family History History reviewed. No pertinent family history.  Social History Social History   Tobacco Use   Smoking status: Former Smoker    Packs/day: 1.00    Years: 27.00    Pack years: 27.00    Types: Cigarettes   Smokeless tobacco: Never Used  Building services engineer Use: Former  Substance Use Topics   Alcohol use: Yes    Comment: every now and then   Drug use: No     Allergies   Codeine, Doxycycline, Hydrocodone, and Tramadol   Review of Systems Review of Systems  Physical Exam Triage Vital Signs ED Triage Vitals  Enc Vitals Group     BP 05/25/20 1310 91/63     Pulse Rate 05/25/20 1310 89     Resp 05/25/20 1310 14     Temp 05/25/20 1310 98.3 F (36.8 C)     Temp Source 05/25/20 1310 Oral     SpO2 05/25/20 1310 99 %     Weight 05/25/20 1308 180 lb (81.6 kg)     Height 05/25/20 1308 5' 3.5" (1.613 m)     Head Circumference --      Peak Flow --      Pain Score 05/25/20 1307 5     Pain Loc --      Pain Edu? --      Excl. in GC? --    No data found.  Updated Vital Signs BP 91/63 (BP Location: Left Arm)    Pulse 89    Temp 98.3 F (36.8 C) (Oral)    Resp 14    Ht 5' 3.5" (1.613 m)    Wt 180 lb (81.6 kg)    LMP 10/07/2015 (Exact Date)    SpO2 99%    BMI 31.39 kg/m   Visual Acuity Right Eye Distance:   Left Eye Distance:   Bilateral Distance:    Right Eye Near:   Left Eye Near:      Bilateral Near:     Physical Exam Constitutional:      General: She is not in acute distress.    Appearance: She is well-developed.  HENT:     Right Ear: Ear canal normal. A middle ear effusion is present.     Left Ear: Ear canal normal. A middle ear effusion is present. Tympanic membrane is erythematous.     Ears:     Comments: Air- fluid bubbling to right TM noted; left is much more red  Cardiovascular:     Rate and Rhythm: Normal rate.  Pulmonary:     Effort: Pulmonary effort is normal.  Skin:    General: Skin is warm and dry.  Neurological:     Mental Status: She is alert and oriented to person, place, and time.      UC Treatments / Results  Labs (all labs ordered are listed, but only abnormal results are displayed) Labs Reviewed - No data to display  EKG   Radiology No results found.  Procedures Procedures (including critical care time)  Medications Ordered in UC Medications - No data to display  Initial Impression / Assessment and Plan / UC Course  I have reviewed the triage vital signs and the nursing notes.  Pertinent labs & imaging results that were available during my care of the patient were reviewed by me and considered in my medical decision making (see chart for details).     Mild AOM to left ear, with air fluid also noted to right TM as well. Antibiotics provided at this time, as well as flonase nasal spray. Return precautions provided. Patient verbalized understanding and agreeable to plan.   Final Clinical Impressions(s) / UC Diagnoses   Final diagnoses:  Non-recurrent acute suppurative otitis media of right ear without spontaneous rupture of tympanic membrane     Discharge Instructions     Push fluids to ensure adequate hydration and keep secretions thin.  Complete course of antibiotics.  Daily nasal spray may help as well.  Tylenol and/or ibuprofen as needed for pain or fevers.      ED Prescriptions  Medication Sig Dispense  Auth. Provider   amoxicillin-clavulanate (AUGMENTIN) 875-125 MG tablet Take 1 tablet by mouth every 12 (twelve) hours for 5 days. 10 tablet Linus Mako B, NP   fluticasone (FLONASE) 50 MCG/ACT nasal spray Place 1 spray into both nostrils daily. 16 g Georgetta Haber, NP     PDMP not reviewed this encounter.   Georgetta Haber, NP 05/25/20 1338

## 2020-05-25 NOTE — Discharge Instructions (Signed)
Push fluids to ensure adequate hydration and keep secretions thin.  Complete course of antibiotics.  Daily nasal spray may help as well.  Tylenol and/or ibuprofen as needed for pain or fevers.

## 2021-11-17 ENCOUNTER — Ambulatory Visit
Admission: EM | Admit: 2021-11-17 | Discharge: 2021-11-17 | Disposition: A | Discharge status: Home / Self Care | Payer: Medicaid Other

## 2021-11-17 ENCOUNTER — Other Ambulatory Visit: Payer: Self-pay

## 2021-11-17 ENCOUNTER — Ambulatory Visit (INDEPENDENT_AMBULATORY_CARE_PROVIDER_SITE_OTHER): Payer: Medicaid Other

## 2021-11-17 DIAGNOSIS — R059 Cough, unspecified: Secondary | ICD-10-CM

## 2021-11-17 DIAGNOSIS — R0781 Pleurodynia: Secondary | ICD-10-CM

## 2021-11-17 DIAGNOSIS — R918 Other nonspecific abnormal finding of lung field: Secondary | ICD-10-CM

## 2021-11-17 DIAGNOSIS — R051 Acute cough: Secondary | ICD-10-CM | POA: Diagnosis not present

## 2021-11-17 MED ORDER — AZITHROMYCIN 250 MG PO TABS: 250.0000 mg | ORAL_TABLET | Freq: Every day | ORAL | 0 refills | Status: AC

## 2021-11-17 MED ORDER — PREDNISONE 20 MG PO TABS: 40.0000 mg | ORAL_TABLET | Freq: Every day | ORAL | 0 refills | Status: AC

## 2021-11-17 MED ORDER — TRAMADOL HCL 50 MG PO TABS: 50.0000 mg | ORAL_TABLET | Freq: Three times a day (TID) | ORAL | 0 refills | Status: AC | PRN

## 2021-11-17 NOTE — ED Triage Notes (Signed)
Patient is here for "Right side pain, under right breast". No injury known. But sick last week "coughing a lot then". Nothing visual seen.  ?

## 2021-11-17 NOTE — Discharge Instructions (Signed)
-  Your chest x-ray does not show any definite rib fractures but as discussed is sometimes difficult to see them on the x-ray. ?- You do have an area of streaking which could represent small collapsed lung sacs or possible pneumonia.  I have sent antibiotic to cover you for the possibility of pneumonia, but I honestly believe its because you are not taking deep breaths since it hurts.  I have sent tramadol to pharmacy for pain.  Take the Benadryl if you need to for itching. ?- Also sent prednisone which will help with inflammation.  You can ice the area.  Can continue the muscle relaxer. ?- Follow-up as needed especially for any worsening symptoms.  Go to the ER for any associated fever, increased pain, difficulty breathing, coughing up blood, feeling weak ?

## 2021-11-17 NOTE — ED Provider Notes (Signed)
?[facility] ? ? ? ?CSN: [id number] ?Arrival date & time: 11/17/21  0913 ? ? ?  ? ?History   ?Chief Complaint ?Chief Complaint  ?Patient presents with  ? Pain  ?  Under right breast (ribs)  ? ? ?HPI ?Susan Schaefer is a 51 y.o. female presenting for right sided rib pain for the past 4 days.  Denies any injury.  She does report that she was sick last week with a cough.  She says the cough was nonproductive.  Cough has improved but she still coughs from time to time.  No associated fever or breathing difficulty but does have increased pain in the right rib area when she laughs, coughs or takes a deep breath.  Denies feeling short of breath or having wheezing.  No hemoptysis.  Denies lower extremity swelling or pain.  Patient reports not having any nasal congestion or sore throat last week when she had the cough.  Reports no exposure to COVID-19.  She has been taking over-the-counter cough medication which has helped her sleep a little bit but she still has some difficulty sleeping because she cannot get comfortable.  She says she has increased pain when she lays on the right side.  Increased pain when she presses on the right ribs.  Patient is also taken tizanidine and meloxicam and says it has not helped.  Patient is a former smoker.  Denies any history of cardiopulmonary disease.  No other concerns.  Of note, patient does have history of compression fracture of lumbar spine for which she is following up with [facility]. ? ?HPI ? ?Past Medical History:  ?Diagnosis Date  ? Anxiety   ? Depression   ? Hypoglycemia   ? ? ?Patient Active Problem List  ? Diagnosis Date Noted  ? Depression 05/16/2019  ? Anxiety 05/16/2019  ? 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 Intentional amitriptyline overdose (HCC) 10/19/2018  ? Intentional drug overdose (HCC)   ? Postoperative state 01/14/2016  ? Hidradenitis suppurativa 10/12/2015  ? Excessive and frequent menstruation with irregular  cycle 10/12/2015  ? History of fracture 01/09/2015  ? ? ?Past Surgical History:  ?Procedure Laterality Date  ? ANTERIOR CRUCIATE LIGAMENT REPAIR Left   ? BILATERAL SALPINGECTOMY Bilateral 01/14/2016  ? Procedure: BILATERAL SALPINGECTOMY;  Surgeon: Suzy Bouchardhomas J Schermerhorn, MD;  Location: [facility];  Service: Gynecology;  Laterality: Bilateral;  ? TONSILLECTOMY    ? VAGINAL HYSTERECTOMY N/A 01/14/2016  ? Procedure: HYSTERECTOMY VAGINAL;  Surgeon: Suzy Bouchardhomas J Schermerhorn, MD;  Location: [facility];  Service: Gynecology;  Laterality: N/A;  ? ? ?OB History   ? ? Gravida  ?1  ? Para  ?   ? Term  ?   ? Preterm  ?   ? AB  ?   ? Living  ?1  ?  ? ? SAB  ?   ? IAB  ?   ? Ectopic  ?   ? Multiple  ?   ? Live Births  ?   ?   ?  ?  ? ? ? ?Home Medications   ? ?Prior to Admission medications   ?Medication Sig Start Date End Date Taking? Authorizing Provider  ?azithromycin (ZITHROMAX) 250 MG tablet Take 1 tablet (250 mg total) by mouth daily. Take first 2 tablets together, then 1 every day until finished. 11/17/21  Yes Eusebio FriendlyEaves, Tashea Othman B, PA-C  ?ibuprofen (ADVIL) 200 MG tablet Take 2-4 tabs q 8 hrs prn pain 01/15/16  Yes  [provider]  ?meloxicam (MOBIC) 15 MG tablet meloxicam 15 mg tablet ? Take 1 tablet every day by oral route.   Yes [provider]  ?predniSONE (DELTASONE) 20 MG tablet Take 2 tablets (40 mg total) by mouth daily for 5 days. 11/17/21 11/22/21 Yes Eusebio Friendly B, PA-C  ?tiZANidine (ZANAFLEX) 4 MG tablet tizanidine 4 mg tablet ? TAKE 1 TABLET BY MOUTH THREE TIMES DAILY AS NEEDED for muscle spasm /pain   Yes [provider]  ?traMADol (ULTRAM) 50 MG tablet Take 1 tablet (50 mg total) by mouth every 8 (eight) hours as needed for up to 5 days. 11/17/21 11/22/21 Yes Shirlee Latch, PA-C  ?albuterol (PROAIR HFA) 108 (90 Base) MCG/ACT inhaler Inhale 1-2 puffs into the lungs every 6 (six) hours as needed for wheezing or shortness of breath. 05/19/20   Georgetta Haber, NP  ?diazepam (VALIUM) 5 MG tablet Valium  5 mg tablet ? 1-2 tabs - 40 min prior to the procedure ? bring with you and take it 40 min prior to the procedure    [provider]  ?escitalopram (LEXAPRO) 10 MG tablet Take 1 tablet (10 mg total) by mouth daily. 10/21/18   Clapacs, Jackquline Denmark, MD  ?fluticasone (FLONASE) 50 MCG/ACT nasal spray Place 1 spray into both nostrils daily. 05/25/20   Georgetta Haber, NP  ?tiZANidine (ZANAFLEX) 2 MG tablet tizanidine 2 mg tablet ? Take 1 tablet 3 times a day by oral route as needed for 30 days.    [provider]  ? ? ?Family History ?No family history on file. ? ?Social History ?Social History  ? ?Tobacco Use  ? Smoking status: Former  ?  Packs/day: 1.00  ?  Years: 27.00  ?  Pack years: 27.00  ?  Types: Cigarettes  ? Smokeless tobacco: Never  ?Vaping Use  ? Vaping Use: Former  ?Substance Use Topics  ? Alcohol use: Not Currently  ?  Comment: every now and then  ? Drug use: No  ? ? ? ?Allergies   ?Codeine, Tramadol, Doxycycline, Hydrocodone, and Oxycodone-acetaminophen ? ? ?Review of Systems ?Review of Systems  ?Constitutional:  Negative for chills, diaphoresis, fatigue and fever.  ?HENT:  Negative for congestion, ear pain, rhinorrhea and sore throat.   ?Respiratory:  Positive for cough. Negative for chest tightness, shortness of breath and wheezing.   ?Cardiovascular:  Positive for chest pain (R rib pain).  ?Gastrointestinal:  Negative for abdominal pain, nausea and vomiting.  ?Musculoskeletal:  Negative for arthralgias, back pain and myalgias.  ?Skin:  Negative for rash.  ?Neurological:  Negative for dizziness, weakness and headaches.  ?Hematological:  Negative for adenopathy.  ? ? ?Physical Exam ?Triage Vital Signs ?ED Triage Vitals  ?Enc Vitals Group  ?   BP 11/17/21 0922 111/65  ?   Pulse Rate 11/17/21 0922 72  ?   Resp 11/17/21 0922 18  ?   Temp 11/17/21 0922 98.5 ?F (36.9 ?C)  ?   Temp Source 11/17/21 0922 Oral  ?   SpO2 11/17/21 0922 100 %  ?   Weight 11/17/21 0920 180 lb (81.6 kg)  ?   Height 11/17/21  0920 5' 3.5" (1.613 m)  ?   Head Circumference --   ?   Peak Flow --   ?   Pain Score 11/17/21 0919 6  ?   Pain Loc --   ?   Pain Edu? --   ?   Excl. in GC? --   ? ?  No data found. ? ?Updated Vital Signs ?BP 111/65 (BP Location: Left Arm)   Pulse 72   Temp 98.5 ?F (36.9 ?C) (Oral)   Resp 18   Ht 5' 3.5" (1.613 m)   Wt 180 lb (81.6 kg)   LMP 10/07/2015 (Exact Date)   SpO2 100%   BMI 31.39 kg/m?  ?   ? ?Physical Exam ?Vitals and nursing note reviewed.  ?Constitutional:   ?   General: She is not in acute distress. ?   Appearance: Normal appearance. She is not ill-appearing or toxic-appearing.  ?HENT:  ?   Head: Normocephalic and atraumatic.  ?   Nose: Nose normal.  ?   Mouth/Throat:  ?   Mouth: Mucous membranes are moist.  ?   Pharynx: Oropharynx is clear.  ?Eyes:  ?   General: No scleral icterus.    ?   Right eye: No discharge.     ?   Left eye: No discharge.  ?   Conjunctiva/sclera: Conjunctivae normal.  ?Cardiovascular:  ?   Rate and Rhythm: Normal rate and regular rhythm.  ?   Heart sounds: Normal heart sounds.  ?Pulmonary:  ?   Effort: Pulmonary effort is normal. No respiratory distress.  ?   Breath sounds: Normal breath sounds. No wheezing, rhonchi or rales.  ?Chest:  ?   Chest wall: Tenderness (TTP along anterior ribs 7-9. No swelling, ecchymosis, step offs) present.  ?Abdominal:  ?   Palpations: Abdomen is soft.  ?   Tenderness: There is no abdominal tenderness.  ?Musculoskeletal:  ?   Cervical back: Neck supple.  ?   Right lower leg: No edema.  ?   Left lower leg: No edema.  ?Skin: ?   General: Skin is dry.  ?Neurological:  ?   General: No focal deficit present.  ?   Mental Status: She is alert. Mental status is at baseline.  ?   Motor: No weakness.  ?   Coordination: Coordination normal.  ?   Gait: Gait normal.  ?Psychiatric:     ?   Mood and Affect: Mood normal.     ?   Behavior: Behavior normal.     ?   Thought Content: Thought content normal.  ? ? ? ?UC Treatments / Results  ?Labs ?(all labs ordered  are listed, but only abnormal results are displayed) ?Labs Reviewed - No data to display ? ?EKG ? ? ?Radiology ?DG Chest 2 View ? ?Result Date: 11/17/2021 ?CLINICAL DATA:  right chest/rib pain, cough EXAM: C

## 2022-09-16 ENCOUNTER — Ambulatory Visit
Admission: EM | Admit: 2022-09-16 | Discharge: 2022-09-16 | Disposition: A | Discharge status: Home / Self Care | Payer: Medicaid Other | Attending: Physician Assistant | Admitting: Physician Assistant

## 2022-09-16 ENCOUNTER — Encounter: Payer: Self-pay | Admitting: Emergency Medicine

## 2022-09-16 DIAGNOSIS — B029 Zoster without complications: Secondary | ICD-10-CM

## 2022-09-16 MED ORDER — HYDROCODONE-ACETAMINOPHEN 5-325 MG PO TABS: 1.0000 | ORAL_TABLET | Freq: Four times a day (QID) | ORAL | 0 refills | Status: AC | PRN

## 2022-09-16 MED ORDER — VALACYCLOVIR HCL 1 G PO TABS: 1000.0000 mg | ORAL_TABLET | Freq: Three times a day (TID) | ORAL | 0 refills | Status: AC

## 2022-09-16 NOTE — Discharge Instructions (Addendum)
-  Your rash is consistent with shingles.  I have sent antiviral medication.  The goal is to keep the rash from spreading.  It is ideal to start within the first 48 to 72 hours. - I also sent pain medication if absolutely needed. - You can put cool compresses on the area but I would not apply anything topically. - You are contagious to others until the rash is crusted over which usually takes 7 to 10 days.  Keep the area covered.  You are most contagious to anyone who has not had chickenpox including elderly, immunocompromised, pregnant women and young children.

## 2022-09-16 NOTE — ED Provider Notes (Signed)
[facility]    CSN: [id number] Arrival date & time: 09/16/22  1535      History   Chief Complaint Chief Complaint  Patient presents with   Rash    HPI Susan Schaefer is a 52 y.o. female presenting for 5-day history of painful pruritic rash of the right lower abdomen.  The area is tender to touch.  She reports an numbing sensation around it as well.  She believes she may have shingles.  She is never had in the past.  She does report history of chickenpox as a child.  She has been applying Aquaphor and taking ibuprofen and Tylenol for pain without relief.  She has not noticed any new lesions in the past couple of days.  No other complaints.  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   Intentional amitriptyline overdose ([place]) 10/19/2018   Intentional drug overdose ([place])    Postoperative state 01/14/2016   Hidradenitis suppurativa 10/12/2015   Excessive and frequent menstruation with irregular cycle 10/12/2015   History of fracture 01/09/2015    Past Surgical History:  Procedure Laterality Date   ANTERIOR CRUCIATE LIGAMENT REPAIR Left    BILATERAL SALPINGECTOMY Bilateral 01/14/2016   Procedure: BILATERAL SALPINGECTOMY;  Surgeon: Boykin Nearing, MD;  Location: [facility];  Service: Gynecology;  Laterality: Bilateral;   TONSILLECTOMY     VAGINAL HYSTERECTOMY N/A 01/14/2016   Procedure: HYSTERECTOMY VAGINAL;  Surgeon: Boykin Nearing, MD;  Location: [facility];  Service: Gynecology;  Laterality: N/A;    OB History     Gravida  1   Para      Term      Preterm      AB      Living  1      SAB      IAB      Ectopic      Multiple      Live Births               Home Medications    Prior to Admission  medications   Medication Sig Start Date End Date Taking? Authorizing Provider  HYDROcodone-acetaminophen (NORCO/VICODIN) 5-325 MG tablet Take 1 tablet by mouth every 6 (six) hours as needed for up to 3 days for severe pain. 09/16/22 09/19/22 Yes Danton Clap, PA-C  albuterol (PROAIR HFA) 108 (90 Base) MCG/ACT inhaler Inhale 1-2 puffs into the lungs every 6 (six) hours as needed for wheezing or shortness of breath. 05/19/20   Zigmund Gottron, NP  azithromycin (ZITHROMAX) 250 MG tablet Take 1 tablet (250 mg total) by mouth daily. Take first 2 tablets together, then 1 every day until finished. 11/17/21   Danton Clap, PA-C  diazepam (VALIUM) 5 MG tablet Valium 5 mg tablet  1-2 tabs - 40 min prior to the procedure  bring with you and take it 40 min prior to the procedure    [provider]  escitalopram (LEXAPRO) 10 MG tablet Take 1 tablet (10 mg total) by mouth daily. 10/21/18   Clapacs, Madie Reno, MD  fluticasone (FLONASE) 50 MCG/ACT nasal spray Place 1 spray into both nostrils daily. 05/25/20   Augusto Gamble B, NP  ibuprofen (ADVIL) 200 MG tablet Take 2-4  tabs q 8 hrs prn pain 01/15/16   [provider]  meloxicam (MOBIC) 15 MG tablet meloxicam 15 mg tablet  Take 1 tablet every day by oral route.    [provider]  tiZANidine (ZANAFLEX) 2 MG tablet tizanidine 2 mg tablet  Take 1 tablet 3 times a day by oral route as needed for 30 days.    [provider]  tiZANidine (ZANAFLEX) 4 MG tablet tizanidine 4 mg tablet  TAKE 1 TABLET BY MOUTH THREE TIMES DAILY AS NEEDED for muscle spasm /pain    [provider]  valACYclovir (VALTREX) 1000 MG tablet Take 1 tablet (1,000 mg total) by mouth 3 (three) times daily. 09/16/22  Yes Shirlee Latch, PA-C    Family History No family history on file.  Social History Social History   Tobacco Use   Smoking status: Former    Packs/day: 1.00    Years: 27.00    Total pack years: 27.00    Types: Cigarettes    Smokeless tobacco: Never  Vaping Use   Vaping Use: Some days  Substance Use Topics   Alcohol use: Not Currently    Comment: every now and then   Drug use: No     Allergies   Codeine, Tramadol, Doxycycline, Hydrocodone, and Oxycodone-acetaminophen   Review of Systems Review of Systems  Constitutional:  Negative for fatigue and fever.  Gastrointestinal:  Negative for abdominal pain.  Musculoskeletal:  Negative for arthralgias and joint swelling.  Skin:  Positive for color change and rash.  Neurological:  Positive for numbness. Negative for weakness.     Physical Exam Triage Vital Signs ED Triage Vitals  Enc Vitals Group     BP      Pulse      Resp      Temp      Temp src      SpO2      Weight      Height      Head Circumference      Peak Flow      Pain Score      Pain Loc      Pain Edu?      Excl. in GC?    No data found.  Updated Vital Signs BP (!) 116/59 (BP Location: Left Arm)   Pulse 91   Temp 98.1 F (36.7 C) (Oral)   Resp 16   LMP 10/07/2015 (Exact Date)   SpO2 97%      Physical Exam Vitals and nursing note reviewed.  Constitutional:      General: She is not in acute distress.    Appearance: Normal appearance. She is not ill-appearing or toxic-appearing.  HENT:     Head: Normocephalic and atraumatic.  Eyes:     General: No scleral icterus.       Right eye: No discharge.        Left eye: No discharge.     Conjunctiva/sclera: Conjunctivae normal.  Cardiovascular:     Rate and Rhythm: Normal rate and regular rhythm.     Heart sounds: Normal heart sounds.  Pulmonary:     Effort: Pulmonary effort is normal. No respiratory distress.     Breath sounds: Normal breath sounds.  Musculoskeletal:     Cervical back: Neck supple.  Skin:    General: Skin is dry.     Findings: Rash present.     Comments: Vesicular lesions on erythematous base along the right flank.  Tender to palpation.  Neurological:  General: No focal deficit present.      Mental Status: She is alert. Mental status is at baseline.     Motor: No weakness.     Gait: Gait normal.  Psychiatric:        Mood and Affect: Mood normal.        Behavior: Behavior normal.        Thought Content: Thought content normal.      UC Treatments / Results  Labs (all labs ordered are listed, but only abnormal results are displayed) Labs Reviewed - No data to display  EKG   Radiology No results found.  Procedures Procedures (including critical care time)  Medications Ordered in UC Medications - No data to display  Initial Impression / Assessment and Plan / UC Course  I have reviewed the triage vital signs and the nursing notes.  Pertinent labs & imaging results that were available during my care of the patient were reviewed by me and considered in my medical decision making (see chart for details).   52 year old female presents for pruritic and painful erythematous rash on the right flank x 5 days.  Taking OTC meds and applying Aquaphor without relief.  I have included an image in the chart of patient's rash which appears to be consistent with shingles.  She has had the rash for 5 days.  Unsure if the Valtrex will be helpful at this point and I did discuss that with her.  She would like to go and try it anyway.  Sent to pharmacy.  Also sent Norco.  She requested something stronger for pain.  She says she can take this but she just needs to take Benadryl when she takes it.  He says she has done this multiple times.  Advised her to be careful and make sure she takes the Benadryl.  If there are any problems, she should return or go to ER. Discussed how long patient has contagious and who she is contagious to.  Reviewed return precautions.  Final Clinical Impressions(s) / UC Diagnoses   Final diagnoses:  Herpes zoster without complication     Discharge Instructions      -Your rash is consistent with shingles.  I have sent antiviral medication.  The goal is to keep  the rash from spreading.  It is ideal to start within the first 48 to 72 hours. - I also sent pain medication if absolutely needed. - You can put cool compresses on the area but I would not apply anything topically. - You are contagious to others until the rash is crusted over which usually takes 7 to 10 days.  Keep the area covered.  You are most contagious to anyone who has not had chickenpox including elderly, immunocompromised, pregnant women and young children.     ED Prescriptions     Medication Sig Dispense Auth. Provider   valACYclovir (VALTREX) 1000 MG tablet Take 1 tablet (1,000 mg total) by mouth 3 (three) times daily. 21 tablet Laurene Footman B, PA-C   HYDROcodone-acetaminophen (NORCO/VICODIN) 5-325 MG tablet Take 1 tablet by mouth every 6 (six) hours as needed for up to 3 days for severe pain. 10 tablet Danton Clap, PA-C      I have reviewed the PDMP during this encounter.   Danton Clap, PA-C 09/16/22 1630

## 2022-09-16 NOTE — ED Triage Notes (Signed)
Pt presents with a painful rash on her right side x 5 days.

## 2022-09-17 ENCOUNTER — Ambulatory Visit: Payer: Self-pay

## 2023-04-04 IMAGING — CR DG CHEST 2V
2 series · 2 of 2 positions shown · non-contrast
Comparison: None.

CLINICAL DATA: right chest/rib pain, cough

EXAM:
CHEST - 2 VIEW

[chest pa]
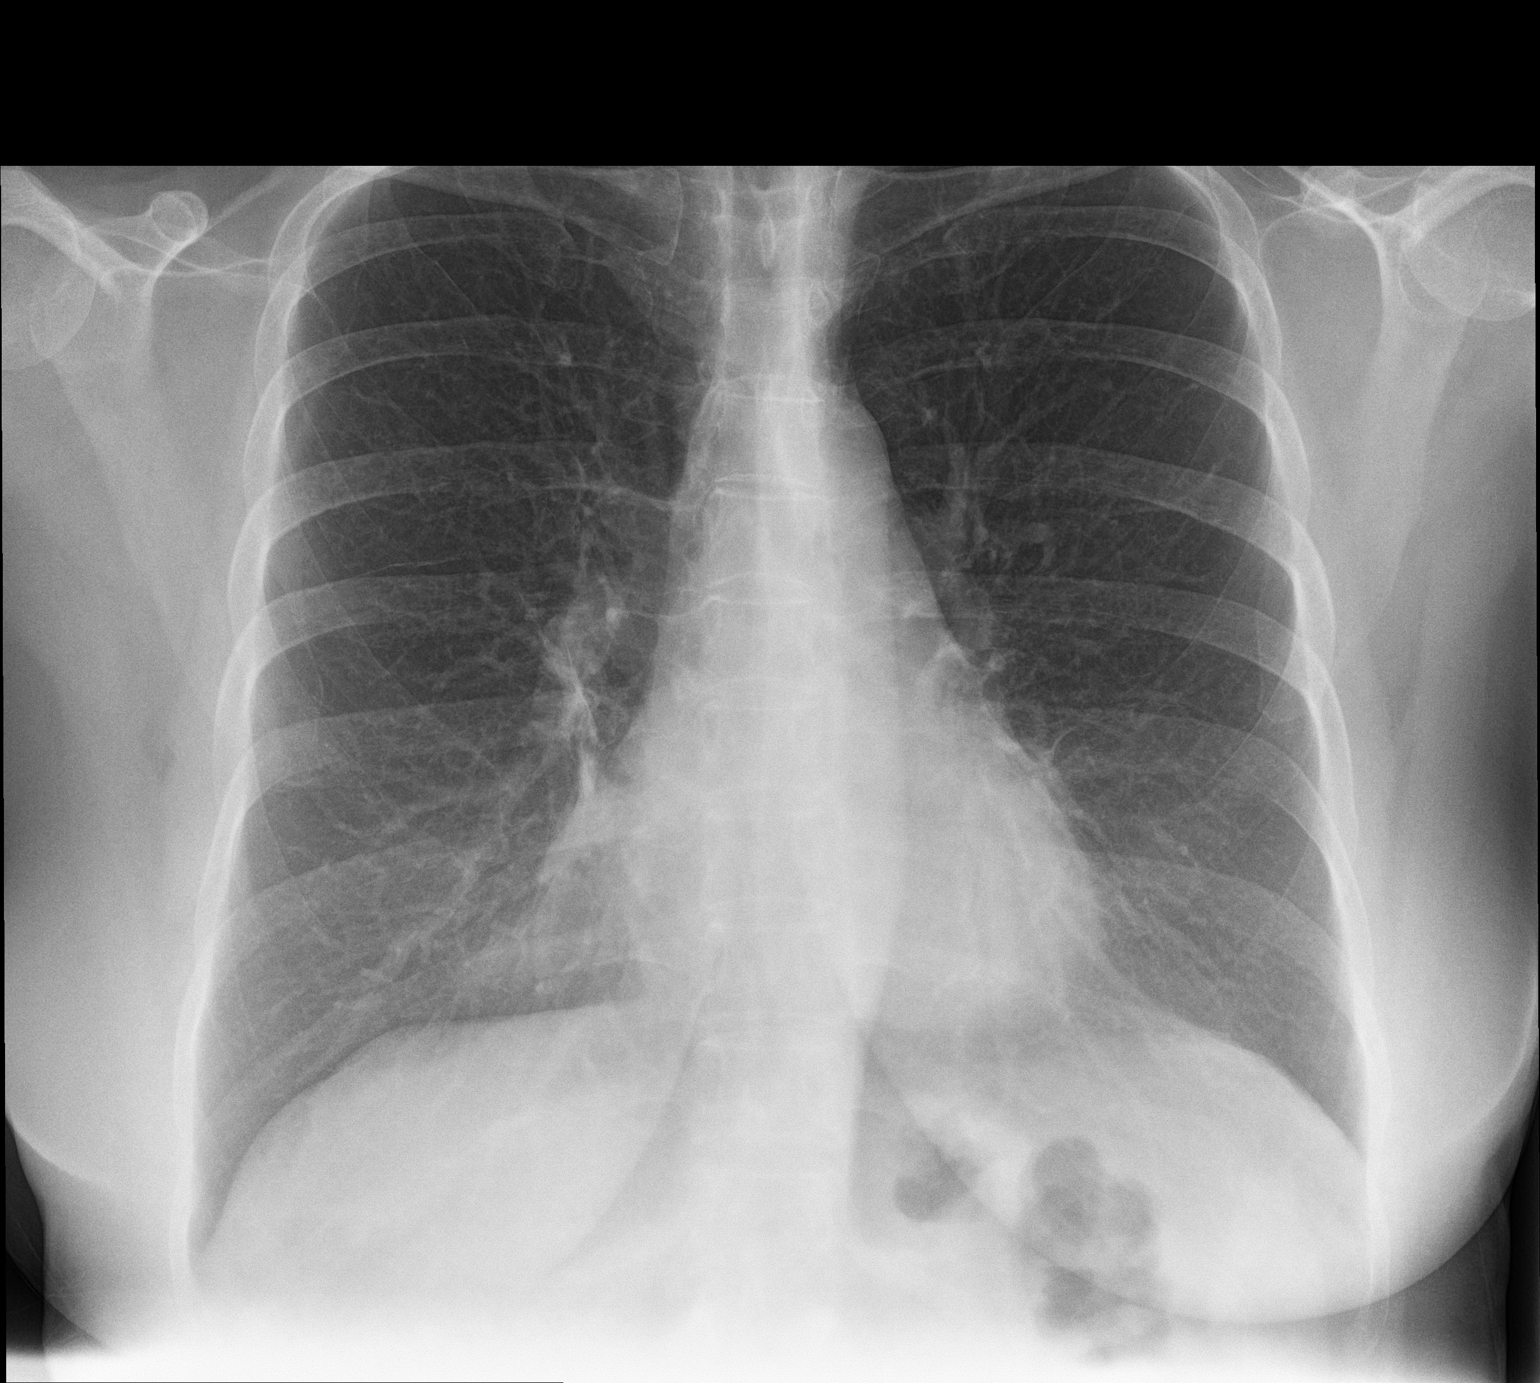

[chest lat]
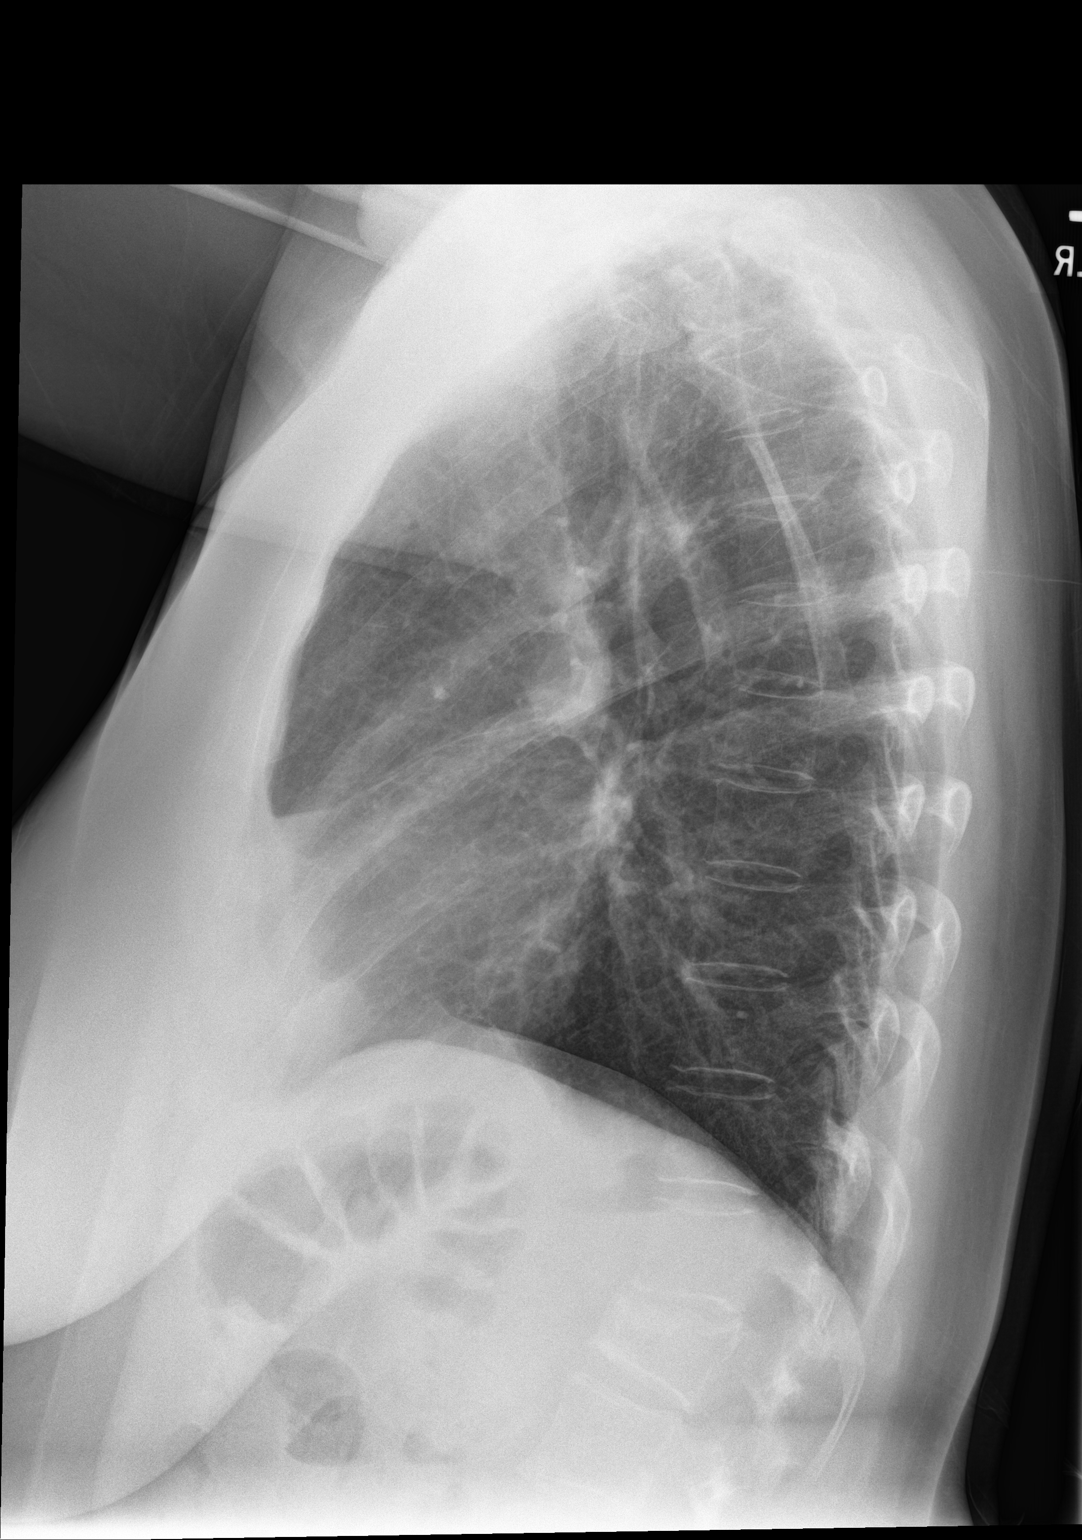

[2 of 2 positions shown; findings below may reference images not displayed]

FINDINGS: Mild streaky right basilar opacities. No visible pneumothorax or
pleural effusions. No evidence of displaced rib fracture.
Age-indeterminate lower thoracic or upper lumbar compression
fracture.
IMPRESSION: 1. Mild streaky right basilar opacities, which could represent
atelectasis and/or pneumonia.
2. Age-indeterminate lower thoracic or upper lumbar compression
fracture. Recommend correlation with the presence or absence of pain
or recent trauma. Cross-sectional imaging could further characterize
if clinically warranted.
